# Patient Record
Sex: Male | Born: 1985 | Hispanic: No | Smoking: Never smoker
Health system: Southern US, Community
[De-identification: ages and names within clinical notes are randomized; demographics above are authoritative.]

## PROBLEM LIST (undated history)

## (undated) DIAGNOSIS — G8929 Other chronic pain: Secondary | ICD-10-CM

## (undated) DIAGNOSIS — J45909 Unspecified asthma, uncomplicated: Secondary | ICD-10-CM

## (undated) DIAGNOSIS — F419 Anxiety disorder, unspecified: Secondary | ICD-10-CM

## (undated) DIAGNOSIS — G4730 Sleep apnea, unspecified: Secondary | ICD-10-CM

## (undated) DIAGNOSIS — F32A Depression, unspecified: Secondary | ICD-10-CM

## (undated) DIAGNOSIS — R519 Headache, unspecified: Secondary | ICD-10-CM

## (undated) DIAGNOSIS — L509 Urticaria, unspecified: Secondary | ICD-10-CM

## (undated) HISTORY — DX: Sleep apnea, unspecified: G47.30

## (undated) HISTORY — DX: Other chronic pain: G89.29

## (undated) HISTORY — DX: Headache, unspecified: R51.9

## (undated) HISTORY — DX: Unspecified asthma, uncomplicated: J45.909

## (undated) HISTORY — DX: Anxiety disorder, unspecified: F41.9

## (undated) HISTORY — DX: Depression, unspecified: F32.A

## (undated) HISTORY — DX: Urticaria, unspecified: L50.9

## (undated) HISTORY — PX: HERNIA REPAIR: SHX51

---

## 2008-06-18 ENCOUNTER — Emergency Department (HOSPITAL_COMMUNITY): Admission: EM | Admit: 2008-06-18 | Discharge: 2008-06-18 | Payer: Self-pay | Admitting: Family Medicine

## 2017-03-31 ENCOUNTER — Other Ambulatory Visit (INDEPENDENT_AMBULATORY_CARE_PROVIDER_SITE_OTHER): Payer: 59

## 2017-03-31 ENCOUNTER — Encounter: Payer: Self-pay | Admitting: Family Medicine

## 2017-03-31 ENCOUNTER — Ambulatory Visit (INDEPENDENT_AMBULATORY_CARE_PROVIDER_SITE_OTHER): Payer: 59 | Admitting: Family Medicine

## 2017-03-31 VITALS — BP 110/80 | HR 62 | Ht 72.0 in | Wt 202.0 lb

## 2017-03-31 DIAGNOSIS — G2589 Other specified extrapyramidal and movement disorders: Secondary | ICD-10-CM | POA: Diagnosis not present

## 2017-03-31 DIAGNOSIS — M25559 Pain in unspecified hip: Secondary | ICD-10-CM | POA: Diagnosis not present

## 2017-03-31 DIAGNOSIS — M24552 Contracture, left hip: Secondary | ICD-10-CM | POA: Diagnosis not present

## 2017-03-31 DIAGNOSIS — M999 Biomechanical lesion, unspecified: Secondary | ICD-10-CM | POA: Diagnosis not present

## 2017-03-31 LAB — TSH: TSH: 2.56 u[IU]/mL (ref 0.35–4.50)

## 2017-03-31 LAB — IBC PANEL
IRON: 106 ug/dL (ref 42–165)
Saturation Ratios: 30.9 % (ref 20.0–50.0)
Transferrin: 245 mg/dL (ref 212.0–360.0)

## 2017-03-31 LAB — CBC WITH DIFFERENTIAL/PLATELET
BASOS PCT: 0.4 % (ref 0.0–3.0)
Basophils Absolute: 0 10*3/uL (ref 0.0–0.1)
EOS PCT: 1.6 % (ref 0.0–5.0)
Eosinophils Absolute: 0.1 10*3/uL (ref 0.0–0.7)
HEMATOCRIT: 50.6 % (ref 39.0–52.0)
Hemoglobin: 17.1 g/dL — ABNORMAL HIGH (ref 13.0–17.0)
LYMPHS ABS: 1.6 10*3/uL (ref 0.7–4.0)
LYMPHS PCT: 34.9 % (ref 12.0–46.0)
MCHC: 33.7 g/dL (ref 30.0–36.0)
MCV: 87.7 fl (ref 78.0–100.0)
MONOS PCT: 7.3 % (ref 3.0–12.0)
Monocytes Absolute: 0.3 10*3/uL (ref 0.1–1.0)
NEUTROS ABS: 2.5 10*3/uL (ref 1.4–7.7)
NEUTROS PCT: 55.8 % (ref 43.0–77.0)
PLATELETS: 161 10*3/uL (ref 150.0–400.0)
RBC: 5.77 Mil/uL (ref 4.22–5.81)
RDW: 12.8 % (ref 11.5–15.5)
WBC: 4.4 10*3/uL (ref 4.0–10.5)

## 2017-03-31 LAB — COMPREHENSIVE METABOLIC PANEL
ALBUMIN: 4.5 g/dL (ref 3.5–5.2)
ALK PHOS: 105 U/L (ref 39–117)
ALT: 24 U/L (ref 0–53)
AST: 22 U/L (ref 0–37)
BUN: 21 mg/dL (ref 6–23)
CO2: 27 mEq/L (ref 19–32)
CREATININE: 1.08 mg/dL (ref 0.40–1.50)
Calcium: 9.9 mg/dL (ref 8.4–10.5)
Chloride: 102 mEq/L (ref 96–112)
GFR: 85.04 mL/min (ref >60.00)
Glucose, Bld: 100 mg/dL — ABNORMAL HIGH (ref 70–99)
POTASSIUM: 3.9 meq/L (ref 3.5–5.1)
SODIUM: 138 meq/L (ref 135–145)
TOTAL PROTEIN: 7.3 g/dL (ref 6.0–8.3)
Total Bilirubin: 0.7 mg/dL (ref 0.2–1.2)

## 2017-03-31 LAB — SEDIMENTATION RATE: Sed Rate: 10 mm/hr (ref 0–15)

## 2017-03-31 NOTE — Assessment & Plan Note (Signed)
Believe a decent amount of patient's pain and discomfort a second her to hip flexor tightness. There is a possibility for patient having more of a polymyalgia and we will get laboratory workup to make sure that there is nothing else organic be contributing to his discomfort. We discussed ergonomics at work. Work with Product/process development scientist to learn home exercises. Follow-up again in 4-6 weeks. Responded well to a supine manipulation.

## 2017-03-31 NOTE — Assessment & Plan Note (Signed)
Decision today to treat with OMT was based on Physical Exam  After verbal consent patient was treated with HVLA, ME, FPR techniques in cervical, thoracic, lumbar and sacral areas  Patient tolerated the procedure well with improvement in symptoms  Patient given exercises, stretches and lifestyle modifications  See medications in patient instructions if given  Patient will follow up in 4 weeks 

## 2017-03-31 NOTE — Progress Notes (Signed)
[facility] [place], [place] [address] Phone: [phone] Subjective:    CC: Hip and shoulder pain  [id number]  Ricardo Alexander is a 31 y.o. male coming in with complaint of shoulder and hip pain.   Patient also discusses shoulder pain. He says he has weakness and instability in the left side. Has a history of numbness in the left arm. He says he has a lot of tension and irritation in certain places in his neck. Says it effects his activity. He feels no pain. He says he feels like he isn't getting any "output" from the arm.   Hip- No hip pain. He says he feels as if his hips are uneven and rotated. Patient also states that there is no significant pain but unfortunately feels like his left hip is not as quite as stable as his right hip. Has seen other providers for it. Has done formal physical therapy 2 times for 6 weeks each time with no significant improvement. Looking for some guidance. Continues to lift weight on a regular basis  Onset- Chronic Location- Less than Character-more of a weakness or instability feeling of the left shoulder and left hip Aggravating factors-seems worse with increasing weight Therapies tried- physical therapy and home exercises multiple times. Severity-2 out of 10     No past medical history on file. No past surgical history on file. Social History   Social History  . Marital status: Married    Spouse name: N/A  . Number of children: N/A  . Years of education: N/A   Social History Main Topics  . Smoking status: Never Smoker  . Smokeless tobacco: Never Used  . Alcohol use Not on file  . Drug use: Unknown  . Sexual activity: Not on file   Other Topics Concern  . Not on file   Social History Narrative  . No narrative on file   Allergies not on file No family history on file. No family history of autoimmune diseases   Past medical history, social, surgical and family history all reviewed in  electronic medical record.  No pertanent information unless stated regarding to the chief complaint.   Review of Systems:Review of systems updated and as accurate as of 03/31/17  No headache, visual changes, nausea, vomiting, diarrhea, constipation, dizziness, abdominal pain, skin rash, fevers, chills, night sweats, weight loss, swollen lymph nodes, body aches, joint swelling, , chest pain, shortness of breath, mood changes. Positive muscle aches  Objective  Blood pressure 110/80, pulse 62, height 6' (1.829 m), weight 202 lb (91.6 kg), SpO2 98 %. Systems examined below as of 03/31/17   General: No apparent distress alert and oriented x3 mood and affect normal, dressed appropriately.  HEENT: Pupils equal, extraocular movements intact  Respiratory: Patient's speak in full sentences and does not appear short of breath  Cardiovascular: No lower extremity edema, non tender, no erythema  Skin: Warm dry intact with no signs of infection or rash on extremities or on axial skeleton.  Abdomen: Soft nontender  Neuro: Cranial nerves II through XII are intact, neurovascularly intact in all extremities with 2+ DTRs and 2+ pulses.  Lymph: No lymphadenopathy of posterior or anterior cervical chain or axillae bilaterally.  Gait normal with good balance and coordination.  MSK:  Non tender with full range of motion and good stability and symmetric strength and tone of  elbows, wrist, knee and ankles bilaterally.  HBZ:JIRC ROM IR: 25 Deg, ER: 45 Deg, Flexion: 120 Deg,  Extension: 100 Deg, Abduction: 45 Deg, Adduction: 45 Deg Strength IR: 5/5, ER: 5/5, Flexion: 5/5, Extension: 5/5, Abduction: 5/5, Adduction: 5/5 Pelvic alignment unremarkable to inspection and palpation. Standing hip rotation and gait without trendelenburg sign / unsteadiness. Greater trochanter without tenderness to palpation. No tenderness over piriformis and greater trochanter. No pain with FABER or FADIR. Patient does have tightness of the  hip flexors bilaterally left greater than right No SI joint tenderness and normal minimal SI movement.  Shoulder: Left Inspection reveals no abnormalities, atrophy or asymmetry. Palpation is normal with no tenderness over AC joint or bicipital groove. ROM is full in all planes. Rotator cuff strength normal throughout. No signs of impingement with negative Neer and Hawkin's tests, empty can sign. Speeds and Yergason's tests normal. No labral pathology noted with negative Obrien's, negative clunk and good stability. Normal scapular function observed. No painful arc and no drop arm sign. No apprehension sign Very mild scapular dyskinesia left couldn't and right  Osteopathic findings C2 flexed rotated and side bent left  T5 extended rotated and side bent left L2 flexed rotated and side bent left  Sacrum left on left     Impression and Recommendations:     This case required medical decision making of moderate complexity.      Note: This dictation was prepared with Dragon dictation along with smaller phrase technology. Any transcriptional errors that result from this process are unintentional.

## 2017-03-31 NOTE — Assessment & Plan Note (Signed)
[id number]; 15 additional minutes spent for Therapeutic exercises as stated in above notes.  This included exercises focusing on stretching, strengthening, with significant focus on eccentric aspects.   Long term goals include an improvement in range of motion, strength, endurance as well as avoiding reinjury. Patient's frequency would include in 1-2 times a day, 3-5 times a week for a duration of 6-12 weeks.  Exercises that included:  Basic scapular stabilization to include adduction and depression of scapula Scaption, focusing on proper movement and good control Internal and External rotation utilizing a theraband, with elbow tucked at side entire time Rows with theraband which was given  Proper technique shown and discussed handout in great detail with ATC.  All questions were discussed and answered.

## 2017-03-31 NOTE — Patient Instructions (Addendum)
Good to see you  Ice 20 minutes 2 times daily. Usually after activity and before bed. Exercises 3 times a week.  pennsaid pinkie amount topically 2 times daily as needed.   We will get labs [facility] the desk helps See me again in 4-6 weeks.

## 2017-04-04 LAB — VITAMIN D 1,25 DIHYDROXY
Vitamin D 1, 25 (OH)2 Total: 65 pg/mL (ref 18–72)
Vitamin D2 1, 25 (OH)2: <8 pg/mL
Vitamin D3 1, 25 (OH)2: 65 pg/mL

## 2017-04-04 LAB — ANA: ANA: NEGATIVE

## 2017-04-04 LAB — PTH, INTACT AND CALCIUM
CALCIUM: 9.7 mg/dL (ref 8.6–10.3)
PTH: 46 pg/mL (ref 14–64)

## 2017-04-04 LAB — RHEUMATOID FACTOR: Rhuematoid fact SerPl-aCnc: <14 IU/mL (ref <14)

## 2017-04-13 ENCOUNTER — Telehealth: Payer: Self-pay

## 2017-04-13 NOTE — Telephone Encounter (Signed)
-----   Message from Lyndal Pulley, DO sent at 04/12/2017  6:35 AM EST ----- When you can call him and tell him all labs are normal

## 2017-04-13 NOTE — Progress Notes (Signed)
Called patient at 1:27pm and they did not pick up. Gave them number to the main line. Will try again tomorrow.

## 2017-04-26 NOTE — Progress Notes (Signed)
Patient did not answer phone again.

## 2017-05-03 NOTE — Progress Notes (Signed)
[facility] [place], [place] [address] Phone: [phone] Subjective:     CC: Muscle pain  [id number]  Ricardo Alexander is a 31 y.o. male coming in with complaint of pain.  Found to have hip flexor tightness.  Also some scapular dyskinesis.  Has responded fairly well to osteopathic manipulation and home exercises.  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  Patient states continuing to have pain.  Seems to be on the left side of his body.  Correct.  Has seen multiple different providers for this previously.  Patient is concerned because he continues to not see any significant improvement.      History reviewed. No pertinent past medical history. History reviewed. No pertinent surgical history. Social History   Socioeconomic History  . Marital status: Married    Spouse name: None  . Number of children: None  . Years of education: None  . Highest education level: None  Social Needs  . Financial resource strain: None  . Food insecurity - worry: None  . Food insecurity - inability: None  . Transportation needs - medical: None  . Transportation needs - non-medical: None  Occupational History  . None  Tobacco Use  . Smoking status: Never Smoker  . Smokeless tobacco: Never Used  Substance and Sexual Activity  . Alcohol use: None  . Drug use: None  . Sexual activity: None  Other Topics Concern  . None  Social History Narrative  . None   Not on File History reviewed. No pertinent family history.  No family history of autoimmune disease   Past medical history, social, surgical and family history all reviewed in electronic medical record.  No pertanent information unless stated regarding to the chief complaint.   Review of Systems:Review of systems updated and as accurate as of 05/04/17  No headache, visual changes, nausea, vomiting, diarrhea, constipation,  dizziness, abdominal pain, skin rash, fevers, chills, night sweats, weight loss, swollen lymph nodes, body aches, joint swelling, muscle aches, chest pain, shortness of breath, mood changes.   Objective  Blood pressure 130/60, pulse 88, height 6' (1.829 m), weight 206 lb (93.4 kg), SpO2 96 %. Systems examined below as of 05/04/17   General: No apparent distress alert and oriented x3 mood and affect normal, dressed appropriately.  HEENT: Pupils equal, extraocular movements intact  Respiratory: Patient's speak in full sentences and does not appear short of breath  Cardiovascular: No lower extremity edema, non tender, no erythema  Skin: Warm dry intact with no signs of infection or rash on extremities or on axial skeleton.  Abdomen: Soft nontender  Neuro: Cranial nerves II through XII are intact, neurovascularly intact in all extremities with 2+ DTRs and 2+ pulses.  Lymph: No lymphadenopathy of posterior or anterior cervical chain or axillae bilaterally.  Gait normal with good balance and coordination.  MSK:  Non tender with full range of motion and good stability and symmetric strength and tone of shoulders, elbows, wrist, hip, knee and ankles bilaterally.  Neck: Inspection unremarkable. No palpable stepoffs. Negative Spurling's maneuver. Very mild loss of range of motion with left-sided rotation. Grip strength and sensation normal in bilateral hands Strength good C4 to T1 distribution No sensory change to C4 to T1 Negative Hoffman sign bilaterally Reflexes normal Mild tightness of the left trapezius area compared to the contralateral side  Osteopathic findings C2 flexed rotated and side bent right C4 flexed rotated and  side bent left C7 flexed rotated and side bent left T3 extended rotated and side bent left inhaled third rib T6 extended rotated and side bent left L3 flexed rotated and side bent left Sacrum right on right     Impression and Recommendations:     This case  required medical decision making of moderate complexity.      Note: This dictation was prepared with Dragon dictation along with smaller phrase technology. Any transcriptional errors that result from this process are unintentional.

## 2017-05-04 ENCOUNTER — Encounter: Payer: Self-pay | Admitting: Family Medicine

## 2017-05-04 ENCOUNTER — Ambulatory Visit (INDEPENDENT_AMBULATORY_CARE_PROVIDER_SITE_OTHER): Payer: 59 | Admitting: Family Medicine

## 2017-05-04 VITALS — BP 130/60 | HR 88 | Ht 72.0 in | Wt 206.0 lb

## 2017-05-04 DIAGNOSIS — M999 Biomechanical lesion, unspecified: Secondary | ICD-10-CM | POA: Diagnosis not present

## 2017-05-04 DIAGNOSIS — G2589 Other specified extrapyramidal and movement disorders: Secondary | ICD-10-CM

## 2017-05-04 NOTE — Patient Instructions (Signed)
Good to see you  Vitamin D 2000 IU  Daily  Turmeric 500mg  daily  Stay active.  Work on upper back more then neck  Dumbbells instead of bar Keep hands within peripheral vision.  See me again in 4 weeks

## 2017-05-04 NOTE — Assessment & Plan Note (Signed)
Still believe a lot of patient's discomfort and pain secondary to more muscle imbalances.  We discussed with patient again about possibly doing more isolation exercises with dumbbells instead of barbells.  Patient has done formal physical therapy previously.  I do not feel that advanced imaging would be warranted with no significant weakness noted on exam today and would likely not change any significant management.  Patient does not want to try any other type of medications.  Follow-up again in 4-6 weeks

## 2017-05-04 NOTE — Assessment & Plan Note (Signed)
Decision today to treat with OMT was based on Physical Exam  After verbal consent patient was treated with HVLA, ME, FPR techniques in cervical, thoracic, lumbar and sacral areas  Patient tolerated the procedure well with improvement in symptoms  Patient given exercises, stretches and lifestyle modifications  See medications in patient instructions if given  Patient will follow up in 4 weeks 

## 2017-06-02 NOTE — Progress Notes (Signed)
[facility] [place], [place] [address] Phone: [phone] Subjective:     CC: Polymyalgia shoulder dyskinesia follow-up  [id number]  Ricardo Alexander is a 31 y.o. male coming in with complaint of back pain and pain all over.  Found to have more of a scapular dyskinesia as well as tight hip flexors.  Patient has been doing some of the home exercises.  Patient did have workup for polymyalgia but did not show anything significant.  Patient states that he continues to have the difficulty on the left side.  Still just feels like it is not functioning correctly.  Patient has been doing the exercises fairly regularly.  Patient feels like she did not have any improvement with the manipulation previously.  Has not noticed any improvement with the vitamin supplementations at this time.  Still frustrated overall.     No past medical history on file. No past surgical history on file. Social History   Socioeconomic History  . Marital status: Married    Spouse name: None  . Number of children: None  . Years of education: None  . Highest education level: None  Social Needs  . Financial resource strain: None  . Food insecurity - worry: None  . Food insecurity - inability: None  . Transportation needs - medical: None  . Transportation needs - non-medical: None  Occupational History  . None  Tobacco Use  . Smoking status: Never Smoker  . Smokeless tobacco: Never Used  Substance and Sexual Activity  . Alcohol use: None  . Drug use: None  . Sexual activity: None  Other Topics Concern  . None  Social History Narrative  . None   Not on File No family history on file.   Past medical history, social, surgical and family history all reviewed in electronic medical record.  No pertanent information unless stated regarding to the chief complaint.   Review of Systems:Review of systems updated and as accurate as of 06/03/17  No headache, visual changes,  nausea, vomiting, diarrhea, constipation, dizziness, abdominal pain, skin rash, fevers, chills, night sweats, weight loss, swollen lymph nodes, body aches, joint swelling, muscle aches, chest pain, shortness of breath, mood changes.   Objective  Blood pressure 130/82, pulse 66, height 6' (1.829 m), weight 208 lb (94.3 kg), SpO2 98 %. Systems examined below as of 06/03/17   General: No apparent distress alert and oriented x3 mood and affect normal, dressed appropriately.  HEENT: Pupils equal, extraocular movements intact  Respiratory: Patient's speak in full sentences and does not appear short of breath  Cardiovascular: No lower extremity edema, non tender, no erythema  Skin: Warm dry intact with no signs of infection or rash on extremities or on axial skeleton.  Abdomen: Soft nontender  Neuro: Cranial nerves II through XII are intact, neurovascularly intact in all extremities with 2+ DTRs and 2+ pulses.  Lymph: No lymphadenopathy of posterior or anterior cervical chain or axillae bilaterally.  Gait normal with good balance and coordination.  MSK:  Non tender with full range of motion and good stability and symmetric strength and tone of shoulders, elbows, wrist, hip, knee and ankles bilaterally.  Neck: Inspection unremarkable. No palpable stepoffs. Negative Spurling's maneuver. Full neck range of motion Grip strength and sensation normal in bilateral hands Strength good C4 to T1 distribution No sensory change to C4 to T1 Negative Hoffman sign bilaterally Reflexes normal Left scapula still has some dyskinesia.  Mild winging noted.  Osteopathic findings C2  flexed rotated and side bent left  C4 flexed rotated and side bent left C6 flexed rotated and side bent left T3 extended rotated and side bent left inhaled third rib T9 extended rotated and side bent left Sacrum right on right    Impression and Recommendations:     This case required medical decision making of moderate  complexity.      Note: This dictation was prepared with Dragon dictation along with smaller phrase technology. Any transcriptional errors that result from this process are unintentional.

## 2017-06-03 ENCOUNTER — Ambulatory Visit (INDEPENDENT_AMBULATORY_CARE_PROVIDER_SITE_OTHER): Payer: 59 | Admitting: Family Medicine

## 2017-06-03 ENCOUNTER — Encounter: Payer: Self-pay | Admitting: Family Medicine

## 2017-06-03 VITALS — BP 130/82 | HR 66 | Ht 72.0 in | Wt 208.0 lb

## 2017-06-03 DIAGNOSIS — G2589 Other specified extrapyramidal and movement disorders: Secondary | ICD-10-CM | POA: Diagnosis not present

## 2017-06-03 DIAGNOSIS — M999 Biomechanical lesion, unspecified: Secondary | ICD-10-CM | POA: Diagnosis not present

## 2017-06-03 MED ORDER — GABAPENTIN 100 MG PO CAPS: 200.0000 mg | ORAL_CAPSULE | Freq: Every day | ORAL | 3 refills | Status: DC

## 2017-06-03 NOTE — Assessment & Plan Note (Signed)
Decision today to treat with OMT was based on Physical Exam  After verbal consent patient was treated with HVLA, ME, FPR techniques in  Thoracic, rib, lumbar and sacral areas  Patient tolerated the procedure well with improvement in symptoms  Patient given exercises, stretches and lifestyle modifications  See medications in patient instructions if given  Patient will follow up in 4-6 weeks

## 2017-06-03 NOTE — Assessment & Plan Note (Signed)
Scapular dyskinesis noted.  Discussed with patient at great length about icing regimen and home exercises.  Discussed avoiding certain activities.  Patient will increase activity slowly.  Started on gabapentin to see if this will make any significant improvement.  Patient will follow up with me again 4-6 weeks

## 2017-06-03 NOTE — Patient Instructions (Signed)
Good to see you  Ricardo Alexander is your friend.  Lets try 200mg  of gabapentin at night to see if this is nerve related.  Keep doing everything else Teresa Coombs or consider Vivi Ferns for the soft tissue as pect  See me again in 4-6 weeks

## 2017-07-07 ENCOUNTER — Ambulatory Visit: Payer: 59 | Admitting: Family Medicine

## 2017-07-07 DIAGNOSIS — Z0289 Encounter for other administrative examinations: Secondary | ICD-10-CM

## 2017-07-07 NOTE — Progress Notes (Deleted)
  [facility] [place], [name] [address] Phone: [phone] Subjective:    I'm seeing this patient by the request  of:    CC: Back pain follow-up  [id number]  Ricardo Alexander is a 32 y.o. male coming in with complaint of back pain follow-up.  Found to have tight hip flexors as well as scapular dyskinesis.  Patient has responded fairly well to osteopathic manipulation and home exercises.  Patient states      No past medical history on file. No past surgical history on file. Social History   Socioeconomic History  . Marital status: Married    Spouse name: Not on file  . Number of children: Not on file  . Years of education: Not on file  . Highest education level: Not on file  Social Needs  . Financial resource strain: Not on file  . Food insecurity - worry: Not on file  . Food insecurity - inability: Not on file  . Transportation needs - medical: Not on file  . Transportation needs - non-medical: Not on file  Occupational History  . Not on file  Tobacco Use  . Smoking status: Never Smoker  . Smokeless tobacco: Never Used  Substance and Sexual Activity  . Alcohol use: Not on file  . Drug use: Not on file  . Sexual activity: Not on file  Other Topics Concern  . Not on file  Social History Narrative  . Not on file   Not on File No family history on file.   Past medical history, social, surgical and family history all reviewed in electronic medical record.  No pertanent information unless stated regarding to the chief complaint.   Review of Systems:Review of systems updated and as accurate as of 07/07/17  No headache, visual changes, nausea, vomiting, diarrhea, constipation, dizziness, abdominal pain, skin rash, fevers, chills, night sweats, weight loss, swollen lymph nodes, body aches, joint swelling, muscle aches, chest pain, shortness of breath, mood changes.   Objective  There were no vitals taken for this visit. Systems  examined below as of 07/07/17   General: No apparent distress alert and oriented x3 mood and affect normal, dressed appropriately.  HEENT: Pupils equal, extraocular movements intact  Respiratory: Patient's speak in full sentences and does not appear short of breath  Cardiovascular: No lower extremity edema, non tender, no erythema  Skin: Warm dry intact with no signs of infection or rash on extremities or on axial skeleton.  Abdomen: Soft nontender  Neuro: Cranial nerves II through XII are intact, neurovascularly intact in all extremities with 2+ DTRs and 2+ pulses.  Lymph: No lymphadenopathy of posterior or anterior cervical chain or axillae bilaterally.  Gait normal with good balance and coordination.  MSK:  Non tender with full range of motion and good stability and symmetric strength and tone of shoulders, elbows, wrist, hip, knee and ankles bilaterally.     Impression and Recommendations:     This case required medical decision making of moderate complexity.      Note: This dictation was prepared with Dragon dictation along with smaller phrase technology. Any transcriptional errors that result from this process are unintentional.

## 2021-03-19 ENCOUNTER — Other Ambulatory Visit: Payer: Self-pay

## 2021-03-19 ENCOUNTER — Ambulatory Visit
Admission: EM | Admit: 2021-03-19 | Discharge: 2021-03-19 | Disposition: A | Discharge status: Home / Self Care | Payer: 59 | Attending: Emergency Medicine | Admitting: Emergency Medicine

## 2021-03-19 DIAGNOSIS — J029 Acute pharyngitis, unspecified: Secondary | ICD-10-CM | POA: Insufficient documentation

## 2021-03-19 DIAGNOSIS — B349 Viral infection, unspecified: Secondary | ICD-10-CM | POA: Diagnosis present

## 2021-03-19 DIAGNOSIS — R0981 Nasal congestion: Secondary | ICD-10-CM | POA: Insufficient documentation

## 2021-03-19 DIAGNOSIS — R110 Nausea: Secondary | ICD-10-CM | POA: Insufficient documentation

## 2021-03-19 DIAGNOSIS — R519 Headache, unspecified: Secondary | ICD-10-CM | POA: Diagnosis present

## 2021-03-19 LAB — POCT INFLUENZA A/B
Influenza A, POC: NEGATIVE
Influenza B, POC: NEGATIVE

## 2021-03-19 LAB — POCT RAPID STREP A (OFFICE): Rapid Strep A Screen: NEGATIVE

## 2021-03-19 NOTE — ED Provider Notes (Signed)
[facility]    CSN: [id number] Arrival date & time: 03/19/21  1304      History   Chief Complaint Chief Complaint  Patient presents with   Nasal Congestion   Chills    HPI Ricardo Alexander is a 35 y.o. male.   Patient is here with his wife today.  Patient states that for the last 3 days he has had body aches, headache, chills, sweats, congestion and sinus pressure.  Patient reports taking 2 home COVID tests, both negative.  Patient's wife states she just finished her quarantine from [place] 4 days ago.  Patient states he does not have a history of recurrent sinusitis or upper respiratory illness, states he is otherwise in good health with no significant medical history.  The history is provided by the patient and the spouse.   History reviewed. No pertinent past medical history.  Patient Active Problem List   Diagnosis Date Noted   Hip flexor tightness, left 03/31/2017   Scapular dyskinesis 03/31/2017   Nonallopathic lesion of thoracic region 03/31/2017   Nonallopathic lesion of lumbosacral region 03/31/2017   Nonallopathic lesion of sacral region 03/31/2017    History reviewed. No pertinent surgical history.     Home Medications    Prior to Admission medications   Medication Sig Start Date End Date Taking? Authorizing Provider  gabapentin (NEURONTIN) 100 MG capsule Take 2 capsules (200 mg total) by mouth at bedtime. 06/03/17   Lyndal Pulley, DO    Family History No family history on file.  Social History Social History   Tobacco Use   Smoking status: Never   Smokeless tobacco: Never  Vaping Use   Vaping Use: Never used  Substance Use Topics   Alcohol use: Never   Drug use: Never     Allergies   Patient has no allergy information on record.   Review of Systems Review of Systems Pertinent findings noted in history of present illness.    Physical Exam Triage Vital Signs ED Triage Vitals  Enc Vitals Group     BP      Pulse      Resp       Temp      Temp src      SpO2      Weight      Height      Head Circumference      Peak Flow      Pain Score      Pain Loc      Pain Edu?      Excl. in [place]?    No data found.  Updated Vital Signs BP 129/80 (BP Location: Right Arm)   Pulse 61   Temp 97.7 F (36.5 C) (Oral)   Resp 20   SpO2 97%   Visual Acuity Right Eye Distance:   Left Eye Distance:   Bilateral Distance:    Right Eye Near:   Left Eye Near:    Bilateral Near:     Physical Exam Vitals and nursing note reviewed.  Constitutional:      Appearance: Normal appearance.  HENT:     Head: Normocephalic and atraumatic.     Right Ear: Tympanic membrane, ear canal and external ear normal.     Left Ear: Tympanic membrane, ear canal and external ear normal.     Nose: Nose normal.     Mouth/Throat:     Mouth: Mucous membranes are moist.     Pharynx: Oropharynx is clear. Uvula midline.  Posterior oropharyngeal erythema and uvula swelling present.  Eyes:     Extraocular Movements: Extraocular movements intact.     Conjunctiva/sclera: Conjunctivae normal.     Pupils: Pupils are equal, round, and reactive to light.  Cardiovascular:     Rate and Rhythm: Normal rate and regular rhythm.     Heart sounds: Normal heart sounds.  Pulmonary:     Effort: Pulmonary effort is normal.     Breath sounds: Normal breath sounds.  Abdominal:     General: Abdomen is flat. Bowel sounds are normal.     Palpations: Abdomen is soft.  Musculoskeletal:        General: Normal range of motion.     Cervical back: Normal range of motion and neck supple.  Skin:    General: Skin is warm and dry.  Neurological:     General: No focal deficit present.     Mental Status: He is alert and oriented to person, place, and time.  Psychiatric:        Mood and Affect: Mood normal.        Behavior: Behavior normal.     UC Treatments / Results  Labs (all labs ordered are listed, but only abnormal results are displayed) Labs Reviewed   CULTURE, GROUP A STREP ([place])  NOVEL CORONAVIRUS, NAA  POCT INFLUENZA A/B  POCT RAPID STREP A (OFFICE)    EKG   Radiology No results found.  Procedures Procedures (including critical care time)  Medications Ordered in UC Medications - No data to display  Initial Impression / Assessment and Plan / UC Course  I have reviewed the triage vital signs and the nursing notes.  Pertinent labs & imaging results that were available during my care of the patient were reviewed by me and considered in my medical decision making (see chart for details).     Physical exam today is unremarkable with the exception of diffuse erythema in his posterior pharynx.  Given this and patient's reported symptoms, I feel is appropriate to test him for strep, COVID and flu.  Patient advised that rapid strep and rapid flu test were both negative and that he will be advised of the results of his COVID test once received.  Conservative care was recommended.  Patient declined AVS prior to departure.  Patient verbalized understanding and agreement of plan as discussed.  All questions were addressed during visit.  Please see discharge instructions below for further details of plan.  Final Clinical Impressions(s) / UC Diagnoses   Final diagnoses:  Sore throat  Congestion of nasal sinus  Nausea without vomiting  Nonintractable headache, unspecified chronicity pattern, unspecified headache type  Viral illness   Discharge Instructions   None    ED Prescriptions   None    PDMP not reviewed this encounter.   Lynden [facility] Scales, [place] 03/20/21 [phone]

## 2021-03-19 NOTE — ED Triage Notes (Signed)
Patient reports for three days he has had chills, sweats, headache, congestion, body aches, and sinus pressure.   Patient reports taking an at home Covid test that was negative.

## 2021-03-20 LAB — NOVEL CORONAVIRUS, NAA: SARS-CoV-2, NAA: NOT DETECTED

## 2021-03-20 LAB — SARS-COV-2, NAA 2 DAY TAT

## 2021-03-22 LAB — CULTURE, GROUP A STREP (THRC): Special Requests: NORMAL

## 2021-10-21 ENCOUNTER — Ambulatory Visit
Admission: EM | Admit: 2021-10-21 | Discharge: 2021-10-21 | Disposition: A | Discharge status: Home / Self Care | Payer: 59

## 2021-10-21 DIAGNOSIS — R519 Headache, unspecified: Secondary | ICD-10-CM | POA: Diagnosis not present

## 2021-10-21 NOTE — ED Provider Notes (Signed)
?[facility] ? ? ? ?CSN: [id number] ?Arrival date & time: 10/21/21  1706 ?  ? ?HISTORY  ? ?Chief Complaint  ?Patient presents with  ? Generalized Body Aches  ? Headache  ? Hoarse  ? ?HPI ?Ricardo Alexander is a 36 y.o. male. Pt c/o swelling in his throat his throat, hoarseness, headahce, nausea, chills, fatigue, and elevated heart rate in the 70s.  Patient's heart rate on arrival is 57.  Pt states he believes he is having issues with his thyroid, and believes his sister has issues with her thyroid. Patient does not display signs/ symptoms of respiratory distress.  Patient states he took an Aleve about an hour ago for his headache which helped. ? ?The history is provided by the patient.  ?History reviewed. No pertinent past medical history. ?Patient Active Problem List  ? Diagnosis Date Noted  ? Hip flexor tightness, left 03/31/2017  ? Scapular dyskinesis 03/31/2017  ? Nonallopathic lesion of thoracic region 03/31/2017  ? Nonallopathic lesion of lumbosacral region 03/31/2017  ? Nonallopathic lesion of sacral region 03/31/2017  ? ?History reviewed. No pertinent surgical history. ? ?Home Medications   ? ?Prior to Admission medications   ?Not on File  ? ?Family History ?History reviewed. No pertinent family history. ?Social History ?Social History  ? ?Tobacco Use  ? Smoking status: Never  ? Smokeless tobacco: Never  ?Vaping Use  ? Vaping Use: Never used  ?Substance Use Topics  ? Alcohol use: Never  ? Drug use: Never  ? ?Allergies   ?Patient has no allergy information on record. ? ?Review of Systems ?Review of Systems ?Pertinent findings noted in history of present illness.  ? ?Physical Exam ?Triage Vital Signs ?ED Triage Vitals  ?Enc Vitals Group  ?   BP 04/04/21 0827 (!) 147/82  ?   Pulse Rate 04/04/21 0827 72  ?   Resp 04/04/21 0827 18  ?   Temp 04/04/21 0827 98.3 ?F (36.8 ?C)  ?   Temp Source 04/04/21 0827 Oral  ?   SpO2 04/04/21 0827 98 %  ?   Weight --   ?   Height --   ?   Head Circumference --   ?   Peak  Flow --   ?   Pain Score 04/04/21 0826 5  ?   Pain Loc --   ?   Pain Edu? --   ?   Excl. in [place]? --   ?No data found. ? ?Updated Vital Signs ?BP 131/77 (BP Location: Left Arm)   Pulse (!) 57   Temp 98.4 ?F (36.9 ?C) (Oral)   Resp 20   SpO2 97%  ? ?Physical Exam ?Vitals and nursing note reviewed.  ?Constitutional:   ?   General: He is not in acute distress. ?   Appearance: Normal appearance. He is not ill-appearing.  ?HENT:  ?   Head: Normocephalic and atraumatic.  ?   Salivary Glands: Right salivary gland is not diffusely enlarged or tender. Left salivary gland is not diffusely enlarged or tender.  ?   Right Ear: Tympanic membrane, ear canal and external ear normal. No drainage. No middle ear effusion. There is no impacted cerumen. Tympanic membrane is not erythematous or bulging.  ?   Left Ear: Tympanic membrane, ear canal and external ear normal. No drainage.  No middle ear effusion. There is no impacted cerumen. Tympanic membrane is not erythematous or bulging.  ?   Nose: Nose normal. No nasal deformity, septal deviation, mucosal edema, congestion  or rhinorrhea.  ?   Right Turbinates: Not enlarged, swollen or pale.  ?   Left Turbinates: Not enlarged, swollen or pale.  ?   Right Sinus: No maxillary sinus tenderness or frontal sinus tenderness.  ?   Left Sinus: No maxillary sinus tenderness or frontal sinus tenderness.  ?   Mouth/Throat:  ?   Lips: Pink. No lesions.  ?   Mouth: Mucous membranes are moist. No oral lesions.  ?   Pharynx: Oropharynx is clear. Uvula midline. No posterior oropharyngeal erythema or uvula swelling.  ?   Tonsils: No tonsillar exudate. 0 on the right. 0 on the left.  ?Eyes:  ?   General: Lids are normal.     ?   Right eye: No discharge.     ?   Left eye: No discharge.  ?   Extraocular Movements: Extraocular movements intact.  ?   Conjunctiva/sclera: Conjunctivae normal.  ?   Right eye: Right conjunctiva is not injected.  ?   Left eye: Left conjunctiva is not injected.  ?Neck:  ?    Trachea: Trachea and phonation normal.  ?Cardiovascular:  ?   Rate and Rhythm: Normal rate and regular rhythm.  ?   Pulses: Normal pulses.  ?   Heart sounds: Normal heart sounds. No murmur heard. ?  No friction rub. No gallop.  ?Pulmonary:  ?   Effort: Pulmonary effort is normal. No accessory muscle usage, prolonged expiration or respiratory distress.  ?   Breath sounds: Normal breath sounds. No stridor, decreased air movement or transmitted upper airway sounds. No decreased breath sounds, wheezing, rhonchi or rales.  ?Chest:  ?   Chest wall: No tenderness.  ?Musculoskeletal:     ?   General: Normal range of motion.  ?   Cervical back: Normal range of motion and neck supple. Normal range of motion.  ?Lymphadenopathy:  ?   Cervical: No cervical adenopathy.  ?Skin: ?   General: Skin is warm and dry.  ?   Findings: No erythema or rash.  ?Neurological:  ?   General: No focal deficit present.  ?   Mental Status: He is alert and oriented to person, place, and time.  ?Psychiatric:     ?   Mood and Affect: Mood normal.     ?   Behavior: Behavior normal.  ? ? ?Visual Acuity ?Right Eye Distance:   ?Left Eye Distance:   ?Bilateral Distance:   ? ?Right Eye Near:   ?Left Eye Near:    ?Bilateral Near:    ? ?UC Couse / Diagnostics / Procedures:  ?  ?EKG ? ?Radiology ?No results found. ? ?Procedures ?Procedures (including critical care time) ? ?UC Diagnoses / Final Clinical Impressions(s)   ?I have reviewed the triage vital signs and the nursing notes. ? ?Pertinent labs & imaging results that were available during my care of the patient were reviewed by me and considered in my medical decision making (see chart for details).   ?Final diagnoses:  ?Acute nonintractable headache, unspecified headache type  ? ?Patient advised that we do not do thyroid screening here.  Patient also advised that he appears well on exam.  Patient advised to schedule appointment with his primary care provider for physical and thyroid screening. ? ?ED  Prescriptions   ?None ?  ? ?PDMP not reviewed this encounter. ? ?Pending results:  ?Labs Reviewed - No data to display ? ? ?Medications Ordered in UC: ?Medications - No data to display ? ?Disposition Upon  Discharge:  ?Condition: stable for discharge home ?Home: take medications as prescribed; routine discharge instructions as discussed; follow up as advised. ? ?Patient presented with an acute illness with associated systemic symptoms and significant discomfort requiring urgent management. In my opinion, this is a condition that a prudent lay person (someone who possesses an average knowledge of health and medicine) may potentially expect to result in complications if not addressed urgently such as respiratory distress, impairment of bodily function or dysfunction of bodily organs.  ? ?Routine symptom specific, illness specific and/or disease specific instructions were discussed with the patient and/or caregiver at length.  ? ?As such, the patient has been evaluated and assessed, work-up was performed and treatment was provided in alignment with urgent care protocols and evidence based medicine.  Patient/parent/caregiver has been advised that the patient may require follow up for further testing and treatment if the symptoms continue in spite of treatment, as clinically indicated and appropriate. ? ?If the patient was tested for COVID-19, Influenza and/or RSV, then the patient/parent/guardian was advised to isolate at home pending the results of his/her diagnostic coronavirus test and potentially longer if they?re positive. I have also advised pt that if his/her COVID-19 test returns positive, it's recommended to self-isolate for at least 10 days after symptoms first appeared AND until fever-free for 24 hours without fever reducer AND other symptoms have improved or resolved. Discussed self-isolation recommendations as well as instructions for household member/close contacts as per the [facility] and [place] DHHS, and also gave  patient the [place] packet with this information. ? ?Patient/parent/caregiver has been advised to return to the [facility] or PCP in 3-5 days if no better; to PCP or the Emergency Department if new signs and symptoms develop,

## 2021-10-21 NOTE — ED Triage Notes (Signed)
Pt C/O swelling in his throat his throat hoarseness, headahce, nausea, chills, and fatigue, elevated heart rate. Pt states he believes he is havsing issues with his thyroid, and believes his sister has issues with her thyroid. Patient does not display signs/ symptoms of respiratory distress.   ? ?Home interventions: aleve about an hour ago for the headache  ?

## 2021-10-29 ENCOUNTER — Other Ambulatory Visit: Payer: Self-pay | Admitting: Family Medicine

## 2021-10-29 DIAGNOSIS — E049 Nontoxic goiter, unspecified: Secondary | ICD-10-CM

## 2021-10-30 ENCOUNTER — Ambulatory Visit
Admission: RE | Admit: 2021-10-30 | Discharge: 2021-10-30 | Disposition: A | Discharge status: Home / Self Care | Payer: 59 | Source: Ambulatory Visit | Attending: Family Medicine | Admitting: Family Medicine

## 2021-10-30 DIAGNOSIS — E049 Nontoxic goiter, unspecified: Secondary | ICD-10-CM

## 2022-03-12 NOTE — Progress Notes (Addendum)
New Patient Note  RE: Zohan Shiflet MRN: [id number] DOB: 07/27/1985 Date of Office Visit: 03/13/2022  Consult requested by: Turmel, Lenord Fellers, PA Primary care provider: Patient, No Pcp Per  Chief Complaint: Other (Food sensitivity. On a diet of meat and eggs only because that is all his body seems to tolerate. Had hives five years ago randomly after going to a restaurant where he ate pasta and sauce.)  History of Present Illness: I had the pleasure of seeing Linnie Mcglocklin for initial evaluation at the [facility] on 03/13/2022. He is a 36 y.o. male, who is referred here by Cephas Darby, PA for the evaluation of hives/food sensitivity.  Patient noted issues with skin irritation, changes in bowel habits, and headaches for the past 5+ years. This seems to be worsening now and noted more foods that cause these problems.   Initially he eliminated gluten and dairy which helped. Overtime he noted that peppers, tomatoes, vinegar and soy seems to worsen symptoms as well.  A few months ago he changed his diet and currently mainly eating a ketogenic diet with meat, eggs, salad and olive oil, fermented vegetables.  Symptoms usually start within 1 hour and sometimes it takes days to resolve.  Past work up includes: none. Dietary History: patient has been eating other foods including eggs, shellfish, meats, and limited vegetables.  Sometimes takes omeprazole prn with good benefit.   No prior GI evaluation. Denies recent tick bites.  He eats red meat on a consistent basis with no issues.   Reviewed las from 10/28/2021 - crp, ANA, B12,ESR, CMP, CBC diff, TSH all unremarkable.  Low vit D.  Assessment and Plan: Ousman is a 36 y.o. male with: Food intolerance Patient noted skin irritation, changes in bowel habits, headaches for the past 5+ years which seems to be worsening. Usually occurs within 1 hour of ingestion but can lasts for days. Current diet mainly consists of meats, eggs,  salad, olive oil and fermented vegetables. No prior allergy testing or GI evaluation.  Discussed with patient that skin prick testing and bloodwork (food IgE levels) check for IgE mediated reactions which his clinical presentation does not really support.  Today's skin testing was negative to food panel.  Keep a food journal with symptoms and foods eaten. Avoid foods that are bothersome. Recommend GI evaluation next - referral placed.  Heartburn See handout for lifestyle and dietary modifications. May take omeprazole as needed.   Return if symptoms worsen or fail to improve.  No orders of the defined types were placed in this encounter.  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 Rhino conjunctivitis: no Medication allergy: no Hymenoptera allergy: no Urticaria:  one episode of hives 5 years ago. Eczema:no History of recurrent infections suggestive of immunodeficency: no  Diagnostics: Skin Testing: Food allergy panel. Negative to food panel. Results discussed with patient/family.  Food Adult Perc - 03/13/22 1400     Time Antigen Placed 1443    Allergen Manufacturer Lavella Hammock    Location Back    Number of allergen test 72     Control-buffer 50% Glycerol Negative    Control-Histamine 1 mg/ml 3+    1. Peanut Negative    2. Soybean Negative    3. Wheat Negative    4. Sesame Negative    5. Milk, cow Negative    6. Egg White, Chicken  Negative    7. Casein Negative    8. Shellfish Mix Negative    9. Fish Mix Negative    10. Cashew Negative    11. Pecan Food Negative    12. [place] Negative    13. Almond Negative    14. Hazelnut Negative    15. [place] nut Negative    16. Coconut Negative    17. Pistachio Negative    18. Catfish Negative    19. Bass Negative    20. Trout Negative    21. Tuna Negative    22. Salmon Negative    23. Flounder Negative    24. Codfish  Negative    25. Shrimp Negative    26. Crab Negative    27. Lobster Negative    28. Oyster Negative    29. Scallops Negative    30. Barley Negative    31. Oat  Negative    32. Rye  Negative    33. Hops Negative    34. Rice Negative    35. Cottonseed Negative    36. Saccharomyces Cerevisiae  Negative    37. Pork Negative    38. [place] Meat Negative    39. Chicken Meat Negative    40. Beef Negative    41. Lamb Negative    42. Tomato Negative    43. White Potato Negative    44. Sweet Potato Negative    45. Pea, Green/English Negative    46. Navy Bean Negative    47. Mushrooms Negative    48. Avocado Negative    49. Onion Negative    50. Cabbage Negative    51. Carrots Negative    52. Celery Negative    53. Corn Negative    54. Cucumber Negative    55. Grape (White seedless) Negative    56. Orange  Negative    57. Banana Negative    58. Apple Negative    59. Peach Negative    60. Strawberry Negative    61. Cantaloupe Negative    62. Watermelon Negative    63. Pineapple Negative    64. Chocolate/Cacao bean Negative    65. Karaya Gum Negative    66. Acacia (Arabic Gum) Negative    67. Cinnamon Negative    68. Nutmeg Negative    69. Ginger Negative    70. Garlic Negative    71. Pepper, black Negative    72. Mustard Negative             Past Medical History: Patient Active Problem List   Diagnosis Date Noted   Asthma 03/13/2022   Food intolerance 03/13/2022   Heartburn 03/13/2022   Hip flexor tightness, left 03/31/2017   Scapular dyskinesis 03/31/2017   Nonallopathic lesion of thoracic region 03/31/2017   Nonallopathic lesion of lumbosacral region 03/31/2017   Nonallopathic lesion of sacral region 03/31/2017   Past Medical History:  Diagnosis Date   Asthma    exercise enduced   Urticaria    Past Surgical History: History reviewed. No pertinent surgical history. Medication List:  No current outpatient medications on file.   No current  facility-administered medications for this visit.   Allergies: Not on File Social History: Social History   Socioeconomic History   Marital status: Divorced    Spouse name: Not on file   Number of children: Not on file   Years of education: Not on file   Highest education level: Not on file  Occupational History  Not on file  Tobacco Use   Smoking status: Never    Passive exposure: Current   Smokeless tobacco: Never  Vaping Use   Vaping Use: Never used  Substance and Sexual Activity   Alcohol use: Never   Drug use: Never   Sexual activity: Not on file  Other Topics Concern   Not on file  Social History Narrative   Not on file   Social Determinants of Health   Financial Resource Strain: Not on file  Food Insecurity: Not on file  Transportation Needs: Not on file  Physical Activity: Not on file  Stress: Not on file  Social Connections: Not on file   Lives in a townhome. Smoking: denies Occupation: support spec  Environmental HistoryFreight forwarder in the house: no Carpet in the family room: no Carpet in the bedroom: yes Heating: gas Cooling: central Pet: yes 1 cat  x 12 yrs  Family History: History reviewed. No pertinent family history. Problem                               Relation Asthma                                   no Eczema                                no Food allergy                          no Allergic rhino conjunctivitis     no  Review of Systems  Constitutional:  Positive for unexpected weight change (20 lbs weight loss due to dietary changes). Negative for appetite change, chills and fever.  HENT:  Negative for congestion and rhinorrhea.   Eyes:  Negative for itching.  Respiratory:  Negative for cough, chest tightness, shortness of breath and wheezing.   Cardiovascular:  Negative for chest pain.  Gastrointestinal:  Positive for abdominal pain and nausea. Negative for vomiting.  Genitourinary:  Negative for difficulty urinating.   Skin:  Positive for rash.  Neurological:  Negative for headaches.    Objective: BP 122/62   Pulse 66   Temp 98.4 F (36.9 C) (Temporal)   Resp 16   Ht _0  (1.778 m)   Wt 196 lb 12.8 oz (89.3 kg)   SpO2 96%   BMI 28.24 kg/m  Body mass index is 28.24 kg/m. Physical Exam Vitals and nursing note reviewed.  Constitutional:      Appearance: Normal appearance. He is well-developed.  HENT:     Head: Normocephalic and atraumatic.     Right Ear: Tympanic membrane and external ear normal.     Left Ear: Tympanic membrane and external ear normal.     Nose: Nose normal.     Mouth/Throat:     Mouth: Mucous membranes are moist.     Pharynx: Oropharynx is clear.  Eyes:     Conjunctiva/sclera: Conjunctivae normal.  Cardiovascular:     Rate and Rhythm: Normal rate and regular rhythm.     Heart sounds: Normal heart sounds. No murmur heard.    No friction rub. No gallop.  Pulmonary:     Effort: Pulmonary effort is normal.     Breath sounds: Normal breath sounds. No wheezing,  rhonchi or rales.  Musculoskeletal:     Cervical back: Neck supple.  Skin:    General: Skin is warm.     Findings: No rash.  Neurological:     Mental Status: He is alert and oriented to person, place, and time.  Psychiatric:        Behavior: Behavior normal.   The plan was reviewed with the patient/family, and all questions/concerned were addressed.  It was my pleasure to see Fuller today and participate in his care. Please feel free to contact me with any questions or concerns.  Sincerely,  Rexene Alberts, DO Allergy & Immunology  [facility] office: [place] office: [phone]

## 2022-03-13 ENCOUNTER — Encounter: Payer: Self-pay | Admitting: Allergy

## 2022-03-13 ENCOUNTER — Ambulatory Visit (INDEPENDENT_AMBULATORY_CARE_PROVIDER_SITE_OTHER): Payer: 59 | Admitting: Allergy

## 2022-03-13 VITALS — BP 122/62 | HR 66 | Temp 98.4°F | Resp 16 | Ht 70.0 in | Wt 196.8 lb

## 2022-03-13 DIAGNOSIS — R12 Heartburn: Secondary | ICD-10-CM | POA: Insufficient documentation

## 2022-03-13 DIAGNOSIS — K9049 Malabsorption due to intolerance, not elsewhere classified: Secondary | ICD-10-CM

## 2022-03-13 DIAGNOSIS — J45909 Unspecified asthma, uncomplicated: Secondary | ICD-10-CM | POA: Insufficient documentation

## 2022-03-13 DIAGNOSIS — T781XXD Other adverse food reactions, not elsewhere classified, subsequent encounter: Secondary | ICD-10-CM

## 2022-03-13 NOTE — Patient Instructions (Addendum)
Today's skin testing showed: Negative to food panel.   Results given.  Discussed with patient hat skin prick testing and bloodwork (food IgE levels) check for IgE mediated reactions which his clinical presentation does not really support.  Keep a food journal with symptoms and foods eaten. Avoid foods that are bothersome. Recommend GI evaluation next - referral placed.  Possible heartburn: See handout for lifestyle and dietary modifications. May take omeprazole as needed.   Skin See below for proper skin care.  Follow up if needed.   Skin care recommendations  Bath time: Always use lukewarm water. AVOID very hot or cold water. Keep bathing time to 5-10 minutes. Do NOT use bubble bath. Use a mild soap and use just enough to wash the dirty areas. Do NOT scrub skin vigorously.  After bathing, pat dry your skin with a towel. Do NOT rub or scrub the skin.  Moisturizers and prescriptions:  ALWAYS apply moisturizers immediately after bathing (within 3 minutes). This helps to lock-in moisture. Use the moisturizer several times a day over the whole body. Good summer moisturizers include: Aveeno, CeraVe, Cetaphil. Good winter moisturizers include: Aquaphor, Vaseline, Cerave, Cetaphil, Eucerin, Vanicream. When using moisturizers along with medications, the moisturizer should be applied about one hour after applying the medication to prevent diluting effect of the medication or moisturize around where you applied the medications. When not using medications, the moisturizer can be continued twice daily as maintenance.  Laundry and clothing: Avoid laundry products with added color or perfumes. Use unscented hypo-allergenic laundry products such as Tide free, Cheer free & gentle, and All free and clear.  If the skin still seems dry or sensitive, you can try double-rinsing the clothes. Avoid tight or scratchy clothing such as wool. Do not use fabric softeners or dyer sheets.

## 2022-03-13 NOTE — Assessment & Plan Note (Signed)
   See handout for lifestyle and dietary modifications.  May take omeprazole as needed.

## 2022-03-13 NOTE — Assessment & Plan Note (Addendum)
Patient noted skin irritation, changes in bowel habits, headaches for the past 5+ years which seems to be worsening. Usually occurs within 1 hour of ingestion but can lasts for days. Current diet mainly consists of meats, eggs, salad, olive oil and fermented vegetables. No prior allergy testing or GI evaluation.   Discussed with patient that skin prick testing and bloodwork (food IgE levels) check for IgE mediated reactions which his clinical presentation does not really support.   Today's skin testing was negative to food panel.   Keep a food journal with symptoms and foods eaten.  Avoid foods that are bothersome.  Recommend GI evaluation next - referral placed.

## 2022-03-31 ENCOUNTER — Telehealth: Payer: Self-pay

## 2022-03-31 NOTE — Telephone Encounter (Signed)
-----   Message from Donnamae Jude, [place] sent at 03/16/2022  1:19 PM EDT ----- Regarding: Referal To GI  ----- Message ----- From: Garnet Sierras, DO Sent: 03/13/2022   5:29 PM EDT To: [facility]  Please place referral to GI - rule out IBS/IBD and Crohn's. Thank you.

## 2022-04-01 ENCOUNTER — Encounter: Payer: Self-pay | Admitting: Gastroenterology

## 2022-05-14 ENCOUNTER — Encounter: Payer: Self-pay | Admitting: Gastroenterology

## 2022-05-14 ENCOUNTER — Other Ambulatory Visit (INDEPENDENT_AMBULATORY_CARE_PROVIDER_SITE_OTHER): Payer: 59

## 2022-05-14 ENCOUNTER — Ambulatory Visit (INDEPENDENT_AMBULATORY_CARE_PROVIDER_SITE_OTHER): Payer: 59 | Admitting: Gastroenterology

## 2022-05-14 VITALS — BP 118/78 | HR 68 | Ht 71.0 in | Wt 195.0 lb

## 2022-05-14 DIAGNOSIS — K9049 Malabsorption due to intolerance, not elsewhere classified: Secondary | ICD-10-CM

## 2022-05-14 LAB — CBC WITH DIFFERENTIAL/PLATELET
Basophils Absolute: 0 10*3/uL (ref 0.0–0.1)
Basophils Relative: 0.4 % (ref 0.0–3.0)
Eosinophils Absolute: 0 10*3/uL (ref 0.0–0.7)
Eosinophils Relative: 0.3 % (ref 0.0–5.0)
HCT: 45.8 % (ref 39.0–52.0)
Hemoglobin: 15.6 g/dL (ref 13.0–17.0)
Lymphocytes Relative: 22.8 % (ref 12.0–46.0)
Lymphs Abs: 1.3 10*3/uL (ref 0.7–4.0)
MCHC: 34.1 g/dL (ref 30.0–36.0)
MCV: 87.8 fl (ref 78.0–100.0)
Monocytes Absolute: 0.5 10*3/uL (ref 0.1–1.0)
Monocytes Relative: 8.1 % (ref 3.0–12.0)
Neutro Abs: 3.9 10*3/uL (ref 1.4–7.7)
Neutrophils Relative %: 68.4 % (ref 43.0–77.0)
Platelets: 196 10*3/uL (ref 150.0–400.0)
RBC: 5.22 Mil/uL (ref 4.22–5.81)
RDW: 13.6 % (ref 11.5–15.5)
WBC: 5.7 10*3/uL (ref 4.0–10.5)

## 2022-05-14 NOTE — Progress Notes (Signed)
HPI : Ricardo Alexander is a very pleasant 36 year old male with a history of exercise-induced asthma and urticaria who is referred to [place] by his Allergist, Dr. Rexene Alberts for further evaluation of multiple food intolerances.  He reports having longstanding issues with gluten and dairy, but in the last 5 years has had increasing intolerances to many other foods, to the point where currently he only eats meats, rice and a few select vegetables.  The symptoms that he experiences when he consumes intolerant foods are atypical.  The patient primarily experiences itching of his skin along the scalp and beard area as well as sinus pressure headaches and what he calls 'digestive disruption'.  When asked to clarify what digestive disruption means, he reports irregular bowel habits and stool consistency.  He states that the itching usually starts within 30 minutes to an hour and will sometimes take days to resolve if he continues to eat the wrong foods. His allergist performed a food panel skin testing which was negative.   He denies having significant abdominal pain, nausea/vomiting or profuse diarrhea in response to eating these foods.  He denies symptoms such as welts, hives or facial swelling.  He does report on 1 occasion about 5 years ago he ate some [place] and broke out in a blotchy, diffuse erythematous rash.  He did not have itching or other systemic symptoms.  This is the only time he has had anything that looks like hives. He reports most significant symptoms with eating gluten, starches and dairy. He occasionally has atypical symptoms of GERD to include globus sensation and nausea.  He cut out certain cereals, and states that this seemed to have resolved the symptoms.  He had been prescribed omeprazole, but no longer takes it because he does not need it. As long as he is avoiding his problem foods, he does not have any sort of GI symptoms.  He has regular bowel movements with formed,  soft stools.  No problems with diarrhea, urgency, abdominal pain or blood in the stool. He has had rare episodes of dysphagia manifested by feeling like food is sitting in his chest when he swallows.  Currently the only vegetables that he eats without symptoms is lettuce, carrots and cucumbers.  He does not eat any fruit anymore. The note from his allergist indicates that labs performed by his primary provider were unremarkable except for a low vitamin D level.  Normal labs included CRP, ANA, CBC, B12, ESR and CMP His weight has been stable.    Patient has no history of eczema.  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  He denies any family history of atopy or celiac disease.  Past Medical History:  Diagnosis Date   Asthma    exercise enduced   Urticaria      No past surgical history on file. No family history on file. Social History   Tobacco Use   Smoking status: Never    Passive exposure: Current   Smokeless tobacco: Never  Vaping Use   Vaping Use: Never used  Substance Use Topics   Alcohol use: Never   Drug use: Never   No current outpatient medications on file.   No current facility-administered medications for this visit.   Not on File   Review of Systems: All systems reviewed and negative except where noted in HPI.    No results found.  Physical Exam: BP 118/78   Pulse 68   Ht _0  (  1.803 m)   Wt 195 lb (88.5 kg)   BMI 27.20 kg/m  Constitutional: Pleasant,well-developed, Caucasian male in no acute distress. HEENT: Normocephalic and atraumatic. Conjunctivae are normal. No scleral icterus. Neck supple.  Cardiovascular: Normal rate, regular rhythm.  Pulmonary/chest: Effort normal and breath sounds normal. No wheezing, rales or rhonchi. Abdominal: Soft, nondistended, nontender. Bowel sounds active throughout. There are no masses palpable. No hepatomegaly. Extremities: no edema Neurological: Alert and oriented to person place and time. Skin: Skin  is warm and dry. No rashes noted. Psychiatric: Normal mood and affect. Behavior is normal.  CBC    Component Value Date/Time   WBC 4.4 03/31/2017 1022   RBC 5.77 03/31/2017 1022   HGB 17.1 (H) 03/31/2017 1022   HCT 50.6 03/31/2017 1022   PLT 161.0 03/31/2017 1022   MCV 87.7 03/31/2017 1022   MCHC 33.7 03/31/2017 1022   RDW 12.8 03/31/2017 1022   LYMPHSABS 1.6 03/31/2017 1022   MONOABS 0.3 03/31/2017 1022   EOSABS 0.1 03/31/2017 1022   BASOSABS 0.0 03/31/2017 1022    CMP     Component Value Date/Time   NA 138 03/31/2017 1022   K 3.9 03/31/2017 1022   CL 102 03/31/2017 1022   CO2 27 03/31/2017 1022   GLUCOSE 100 (H) 03/31/2017 1022   BUN 21 03/31/2017 1022   CREATININE 1.08 03/31/2017 1022   CALCIUM 9.9 03/31/2017 1022   CALCIUM 9.7 03/31/2017 1022   PROT 7.3 03/31/2017 1022   ALBUMIN 4.5 03/31/2017 1022   AST 22 03/31/2017 1022   ALT 24 03/31/2017 1022   ALKPHOS 105 03/31/2017 1022   BILITOT 0.7 03/31/2017 1022     ASSESSMENT AND PLAN: 36 year old male with several years of atypical food intolerance symptoms, manifested primarily by itching of his skin in the absence of hives as well as sinus pressure headaches and vague changes in his bowel habits, without prominent GI symptoms to include abdominal pain, nausea/vomiting or profuse diarrhea.  Food allergen panel negative by his allergist.  I did not see if that he has been tested for celiac disease. Overall, I feel is very unlikely that we will find any objective abnormalities to explain his unusual symptoms to all these various foods.  I think an upper endoscopy with biopsies of the stomach, duodenum and esophagus is not unreasonable to more definitively exclude eosinophilic disorders.  Will also screen for celiac disease with TTG/IgA and get a repeat CBC with differential (I do not have this available for review). Given his extremely restrictive diet, he may benefit from a nutrition referral to analyze his diet and  provide any recommendations to avoid nutritional deficiencies.  Food intolerance - EGD - CBC, tTG/IgA  The details, risks (including bleeding, perforation, infection, missed lesions, medication reactions and possible hospitalization or surgery if complications occur), benefits, and alternatives to EGD with possible biopsy and possible dilation were discussed with the patient and he consents to proceed.   Kooper Godshall E. Candis Schatz, MD [facility]  Garnet Sierras, DO

## 2022-05-14 NOTE — Patient Instructions (Signed)
_______________________________________________________  If you are age 35 or older, your body mass index should be between 23-30. Your Body mass index is 27.2 kg/m. If this is out of the aforementioned range listed, please consider follow up with your Primary Care Provider.  If you are age 80 or younger, your body mass index should be between 19-25. Your Body mass index is 27.2 kg/m. If this is out of the aformentioned range listed, please consider follow up with your Primary Care Provider.   You have been scheduled for an endoscopy. Please follow written instructions given to you at your visit today. If you use inhalers (even only as needed), please bring them with you on the day of your procedure.  Your provider has requested that you go to the basement level for lab work before leaving today. Press "B" on the elevator. The lab is located at the first door on the left as you exit the elevator.   The [facility] providers would like to encourage you to use [facility] to communicate with providers for non-urgent requests or questions.  Due to long hold times on the telephone, sending your provider a message by [facility] may be a faster and more efficient way to get a response.  Please allow 48 business hours for a response.  Please remember that this is for non-urgent requests.   It was a pleasure to see you today!  Thank you for trusting me with your gastrointestinal care!    Scott E. Candis Schatz, MD

## 2022-05-15 LAB — IGA: Immunoglobulin A: 169 mg/dL (ref 47–310)

## 2022-05-15 LAB — TISSUE TRANSGLUTAMINASE, IGA: (tTG) Ab, IgA: <1 U/mL

## 2022-05-20 NOTE — Progress Notes (Signed)
Ricardo Alexander,  Your labs looked good.  No evidence of celiac disease and no eosinophilia in your blood counts

## 2022-06-04 ENCOUNTER — Ambulatory Visit (AMBULATORY_SURGERY_CENTER): Payer: 59 | Admitting: Gastroenterology

## 2022-06-04 ENCOUNTER — Encounter: Payer: Self-pay | Admitting: Gastroenterology

## 2022-06-04 VITALS — BP 99/58 | HR 62 | Temp 98.2°F | Resp 12 | Ht 71.0 in | Wt 195.0 lb

## 2022-06-04 DIAGNOSIS — K2100 Gastro-esophageal reflux disease with esophagitis, without bleeding: Secondary | ICD-10-CM

## 2022-06-04 DIAGNOSIS — C162 Malignant neoplasm of body of stomach: Secondary | ICD-10-CM | POA: Diagnosis not present

## 2022-06-04 DIAGNOSIS — D131 Benign neoplasm of stomach: Secondary | ICD-10-CM

## 2022-06-04 DIAGNOSIS — R1310 Dysphagia, unspecified: Secondary | ICD-10-CM | POA: Diagnosis not present

## 2022-06-04 DIAGNOSIS — K9049 Malabsorption due to intolerance, not elsewhere classified: Secondary | ICD-10-CM

## 2022-06-04 DIAGNOSIS — C163 Malignant neoplasm of pyloric antrum: Secondary | ICD-10-CM | POA: Diagnosis not present

## 2022-06-04 MED ORDER — SODIUM CHLORIDE 0.9 % IV SOLN: 500.0000 mL | Freq: Once | INTRAVENOUS | Status: DC

## 2022-06-04 NOTE — Patient Instructions (Addendum)
Resume previous diet and continue present medications. Await pathology results. Further recommendations pending pathology results.   YOU HAD AN ENDOSCOPIC PROCEDURE TODAY AT [facility]:   Refer to the procedure report that was given to you for any specific questions about what was found during the examination.  If the procedure report does not answer your questions, please call your gastroenterologist to clarify.  If you requested that your care partner not be given the details of your procedure findings, then the procedure report has been included in a sealed envelope for you to review at your convenience later.  YOU SHOULD EXPECT: Some feelings of bloating in the abdomen. Passage of more gas than usual.  Walking can help get rid of the air that was put into your GI tract during the procedure and reduce the bloating. If you had a lower endoscopy (such as a colonoscopy or flexible sigmoidoscopy) you may notice spotting of blood in your stool or on the toilet paper. If you underwent a bowel prep for your procedure, you may not have a normal bowel movement for a few days.  Please Note:  You might notice some irritation and congestion in your nose or some drainage.  This is from the oxygen used during your procedure.  There is no need for concern and it should clear up in a day or so.  SYMPTOMS TO REPORT IMMEDIATELY:   Following upper endoscopy (EGD)  Vomiting of blood or coffee ground material  New chest pain or pain under the shoulder blades  Painful or persistently difficult swallowing  New shortness of breath  Fever of 100F or higher  Black, tarry-looking stools  For urgent or emergent issues, a gastroenterologist can be reached at any hour by calling [phone]. Do not use MyChart messaging for urgent concerns.    DIET:  We do recommend a small meal at first, but then you may proceed to your regular diet.  Drink plenty of fluids but you should avoid alcoholic  beverages for 24 hours.  ACTIVITY:  You should plan to take it easy for the rest of today and you should NOT DRIVE or use heavy machinery until tomorrow (because of the sedation medicines used during the test).    FOLLOW UP: Our staff will call the number listed on your records the next business day following your procedure.  We will call around 7:15- 8:00 am to check on you and address any questions or concerns that you may have regarding the information given to you following your procedure. If we do not reach you, we will leave a message.     If any biopsies were taken you will be contacted by phone or by letter within the next 1-3 weeks.  Please call us at [phone] if you have not heard about the biopsies in 3 weeks.    SIGNATURES/CONFIDENTIALITY: You and/or your care partner have signed paperwork which will be entered into your electronic medical record.  These signatures attest to the fact that that the information above on your After Visit Summary has been reviewed and is understood.  Full responsibility of the confidentiality of this discharge information lies with you and/or your care-partner.

## 2022-06-04 NOTE — Op Note (Signed)
[facility] Patient Name: Ricardo Alexander Procedure Date: 06/04/2022 3:38 PM MRN: [id number] Endoscopist: Nicki Reaper E. Candis Schatz , MD, [id number] Age: 36 Referring MD:  Date of Birth: 08/19/1985 Gender: Male Account #: [id number] Procedure:                Upper GI endoscopy Indications:              Dyspepsia, multiple food intolerances Medicines:                Monitored Anesthesia Care Procedure:                Pre-Anesthesia Assessment:                           - Prior to the procedure, a History and Physical                            was performed, and patient medications and                            allergies were reviewed. The patient's tolerance of                            previous anesthesia was also reviewed. The risks                            and benefits of the procedure and the sedation                            options and risks were discussed with the patient.                            All questions were answered, and informed consent                            was obtained. Prior Anticoagulants: The patient has                            taken no anticoagulant or antiplatelet agents. ASA                            Grade Assessment: II - A patient with mild systemic                            disease. After reviewing the risks and benefits,                            the patient was deemed in satisfactory condition to                            undergo the procedure.                           After obtaining informed consent, the endoscope was  passed under direct vision. Throughout the                            procedure, the patient's blood pressure, pulse, and                            oxygen saturations were monitored continuously. The                            Endoscope was introduced through the mouth, and                            advanced to the third part of duodenum. The upper                            GI endoscopy  was accomplished without difficulty.                            The patient tolerated the procedure well. Scope In: Scope Out: Findings:                 The examined portions of the nasopharynx,                            oropharynx and larynx were normal.                           Localized mild mucosal changes characterized by                            white specks were found in the lower third of the                            esophagus. Biopsies were obtained from the proximal                            and distal esophagus with cold forceps for                            histology of suspected eosinophilic esophagitis.                            Estimated blood loss was minimal.                           The exam of the esophagus was otherwise normal.                           The gastroesophageal flap valve was visualized                            endoscopically and classified as Hill Grade IV (no                            fold, wide  open lumen, hiatal hernia present).                           The entire examined stomach was normal. Biopsies                            were taken with a cold forceps for histology and                            for H. pylori testing. Estimated blood loss was                            minimal.                           The examined duodenum was normal. Biopsies for                            histology were taken with a cold forceps for                            evaluation of celiac disease and histology.                            Estimated blood loss was minimal. Complications:            No immediate complications. Estimated Blood Loss:     Estimated blood loss was minimal. Impression:               - The examined portions of the nasopharynx,                            oropharynx and larynx were normal.                           - White specked mucosa in the esophagus. Suspicious                            for possible candidiasis. Biopsied.                            - Gastroesophageal flap valve classified as Hill                            Grade IV (no fold, wide open lumen, hiatal hernia                            present).                           - Normal stomach. Biopsied.                           - Normal examined duodenum. Biopsied.                           -  Biopsies were taken with a cold forceps for                            evaluation of eosinophilic esophagitis.                           - No obvious endoscopic abnormalities to explain                            patient's numerous food intolerances. Recommendation:           - Patient has a contact number available for                            emergencies. The signs and symptoms of potential                            delayed complications were discussed with the                            patient. Return to normal activities tomorrow.                            Written discharge instructions were provided to the                            patient.                           - Resume previous diet.                           - Continue present medications.                           - Await pathology results.                           - Further recommendations based on pathology                            results. Jady Braggs E. Candis Schatz, MD 06/04/2022 4:10:45 PM This report has been signed electronically.

## 2022-06-04 NOTE — Progress Notes (Signed)
Pt's states no medical or surgical changes since previsit or office visit. VS assessed by D.T 

## 2022-06-04 NOTE — Progress Notes (Signed)
History and Physical Interval Note:  06/04/2022 3:45 PM  Ricardo Alexander  has presented today for endoscopic procedure(s), with the diagnosis of  Encounter Diagnosis  Name Primary?   Food intolerance Yes  .  The various methods of evaluation and treatment have been discussed with the patient and/or family. After consideration of risks, benefits and other options for treatment, the patient has consented to  the endoscopic procedure(s).   The patient's history has been reviewed, patient examined, no change in status, stable for endoscopic procedure(s).  I have reviewed the patient's chart and labs.  Questions were answered to the patient's satisfaction.     Randa Riss E. Candis Schatz, MD [facility]

## 2022-06-04 NOTE — Progress Notes (Signed)
Report to PACU, RN, vss, BBS= Clear.  

## 2022-06-04 NOTE — Progress Notes (Signed)
Called to room to assist during endoscopic procedure.  Patient ID and intended procedure confirmed with present staff. Received instructions for my participation in the procedure from the performing physician.  

## 2022-06-05 ENCOUNTER — Telehealth: Payer: Self-pay | Admitting: *Deleted

## 2022-06-05 NOTE — Telephone Encounter (Signed)
Post procedure follow up call placed, no answer and left VM.  

## 2022-06-11 ENCOUNTER — Other Ambulatory Visit: Payer: Self-pay | Admitting: Surgery

## 2022-06-17 NOTE — Progress Notes (Signed)
Ricardo Alexander,  The biopsies of your esophagus showed changes suggestive of mild acid reflux.  These findings are likely of limited significance, as your symptoms are very unlikely to be related to GERD. The biopsies of your stomach and duodenum were unremarkable.  There was no evidence of inflammation or increased eosinophils. Please follow up with me in the clinic to discuss further management of your symptoms.

## 2022-06-20 ENCOUNTER — Emergency Department (HOSPITAL_COMMUNITY): Payer: 59

## 2022-06-20 ENCOUNTER — Encounter (HOSPITAL_COMMUNITY): Payer: Self-pay | Admitting: Emergency Medicine

## 2022-06-20 ENCOUNTER — Emergency Department (HOSPITAL_COMMUNITY)
Admission: EM | Admit: 2022-06-20 | Discharge: 2022-06-21 | Disposition: A | Discharge status: Home / Self Care | Payer: 59 | Attending: Emergency Medicine | Admitting: Emergency Medicine

## 2022-06-20 ENCOUNTER — Other Ambulatory Visit: Payer: Self-pay

## 2022-06-20 DIAGNOSIS — F1290 Cannabis use, unspecified, uncomplicated: Secondary | ICD-10-CM | POA: Insufficient documentation

## 2022-06-20 DIAGNOSIS — R002 Palpitations: Secondary | ICD-10-CM | POA: Insufficient documentation

## 2022-06-20 DIAGNOSIS — E860 Dehydration: Secondary | ICD-10-CM | POA: Insufficient documentation

## 2022-06-20 DIAGNOSIS — F419 Anxiety disorder, unspecified: Secondary | ICD-10-CM | POA: Insufficient documentation

## 2022-06-20 DIAGNOSIS — J45909 Unspecified asthma, uncomplicated: Secondary | ICD-10-CM | POA: Diagnosis not present

## 2022-06-20 DIAGNOSIS — E876 Hypokalemia: Secondary | ICD-10-CM | POA: Insufficient documentation

## 2022-06-20 DIAGNOSIS — R0789 Other chest pain: Secondary | ICD-10-CM | POA: Diagnosis not present

## 2022-06-20 LAB — BASIC METABOLIC PANEL
Anion gap: 9 (ref 5–15)
BUN: 27 mg/dL — ABNORMAL HIGH (ref 6–20)
CO2: 23 mmol/L (ref 22–32)
Calcium: 8.9 mg/dL (ref 8.9–10.3)
Chloride: 102 mmol/L (ref 98–111)
Creatinine, Ser: 1.3 mg/dL — ABNORMAL HIGH (ref 0.61–1.24)
GFR, Estimated: >60 mL/min (ref >60)
Glucose, Bld: 145 mg/dL — ABNORMAL HIGH (ref 70–99)
Potassium: 3.2 mmol/L — ABNORMAL LOW (ref 3.5–5.1)
Sodium: 134 mmol/L — ABNORMAL LOW (ref 135–145)

## 2022-06-20 LAB — TROPONIN I (HIGH SENSITIVITY): Troponin I (High Sensitivity): 7 ng/L (ref <18)

## 2022-06-20 LAB — CBC
HCT: 44.5 % (ref 39.0–52.0)
Hemoglobin: 15.1 g/dL (ref 13.0–17.0)
MCH: 30 pg (ref 26.0–34.0)
MCHC: 33.9 g/dL (ref 30.0–36.0)
MCV: 88.3 fL (ref 80.0–100.0)
Platelets: 170 10*3/uL (ref 150–400)
RBC: 5.04 MIL/uL (ref 4.22–5.81)
RDW: 12.3 % (ref 11.5–15.5)
WBC: 5.4 10*3/uL (ref 4.0–10.5)
nRBC: 0 % (ref 0.0–0.2)

## 2022-06-20 NOTE — ED Triage Notes (Signed)
Pt consumed THC gummies today at 6p and 8:30p, did not realize the concentration was twice as high as he thought. Now experiencing chest pain, SOB, palpitations. Denies trouble swallowing, hives.

## 2022-06-21 LAB — TROPONIN I (HIGH SENSITIVITY): Troponin I (High Sensitivity): 6 ng/L (ref <18)

## 2022-06-21 LAB — TSH: TSH: 2.649 u[IU]/mL (ref 0.350–4.500)

## 2022-06-21 MED ORDER — POTASSIUM CHLORIDE CRYS ER 20 MEQ PO TBCR
40.0000 meq | EXTENDED_RELEASE_TABLET | Freq: Once | ORAL | Status: AC
  Administered 2022-06-21: 40 meq via ORAL
  Filled 2022-06-21: qty 2

## 2022-06-21 NOTE — ED Notes (Signed)
IV removed. Pt verbalized understanding of discharge instructions. Pt ambulated from ED with steady gait.

## 2022-06-21 NOTE — Discharge Instructions (Addendum)
Please refrain from [facility] use in the future  Please increase your water intake over the next few days  Your thyroid hormone tests will result on MyChart, please follow-up with your PCP in regards to this and your visit today in the emergency department  It was a pleasure caring for you today in the emergency department.  Please return to the emergency department for any worsening or worrisome symptoms.

## 2022-06-21 NOTE — ED Provider Notes (Signed)
[name] DEPT Provider Note  CSN: [id number] Arrival date & time: 06/20/22 2228  Chief Complaint(s) Palpitations  HPI Ricardo Alexander is a 37 y.o. male with past medical history as below, significant for anxiety, asthma, depression  who presents to the ED with complaint of thc ingestion. First time THC use, around 1800 last night. Felt chest tightness, palpitations.  Thought he was having a panic attack.  No other coingestions.  No history of prior THC use.  Currently he is feeling much better.  No nausea or vomiting, no chest pain or dyspnea, no longer having palpitations.  Nonsuicidal.  Feels like his anxiety is improving.  No other complaints offered  Past Medical History Past Medical History:  Diagnosis Date   Anxiety    Asthma    exercise enduced   Asthma    Chronic headaches    Depression    Sleep apnea    Urticaria    Patient Active Problem List   Diagnosis Date Noted   Asthma 03/13/2022   Food intolerance 03/13/2022   Heartburn 03/13/2022   Hip flexor tightness, left 03/31/2017   Scapular dyskinesis 03/31/2017   Nonallopathic lesion of thoracic region 03/31/2017   Nonallopathic lesion of lumbosacral region 03/31/2017   Nonallopathic lesion of sacral region 03/31/2017   Home Medication(s) Prior to Admission medications   Medication Sig Start Date End Date Taking? Authorizing Provider  albuterol (VENTOLIN HFA) 108 (90 Base) MCG/ACT inhaler Inhale into the lungs every 6 (six) hours as needed for wheezing or shortness of breath.    [provider]                                                                                                                                    Past Surgical History Past Surgical History:  Procedure Laterality Date   HERNIA REPAIR     Family History Family History  Problem Relation Age of Onset   Diabetes Maternal Grandfather    Colon cancer Neg Hx    Rectal cancer Neg Hx    Stomach cancer Neg  Hx    Esophageal cancer Neg Hx     Social History Social History   Tobacco Use   Smoking status: Never    Passive exposure: Current   Smokeless tobacco: Never  Vaping Use   Vaping Use: Never used  Substance Use Topics   Alcohol use: Never   Drug use: Never   Allergies Patient has no known allergies.  Review of Systems Review of Systems  Constitutional:  Negative for chills and fever.  HENT:  Negative for facial swelling and trouble swallowing.   Eyes:  Negative for photophobia and visual disturbance.  Respiratory:  Positive for chest tightness. Negative for cough and shortness of breath.   Cardiovascular:  Positive for palpitations. Negative for chest pain.  Gastrointestinal:  Negative for abdominal pain, nausea and vomiting.  Endocrine: Negative for polydipsia and polyuria.  Genitourinary:  Negative for difficulty urinating and hematuria.  Musculoskeletal:  Negative for gait problem and joint swelling.  Skin:  Negative for pallor and rash.  Neurological:  Negative for syncope and headaches.  Psychiatric/Behavioral:  Negative for agitation and confusion. The patient is nervous/anxious.     Physical Exam Vital Signs  I have reviewed the triage vital signs BP 118/65   Pulse 72   Temp 98.6 F (37 C) (Oral)   Resp (!) 21   Wt 88 kg   SpO2 97%   BMI 27.06 kg/m  Physical Exam Vitals and nursing note reviewed.  Constitutional:      General: He is not in acute distress.    Appearance: Normal appearance. He is well-developed. He is not ill-appearing, toxic-appearing or diaphoretic.  HENT:     Head: Normocephalic and atraumatic.     Right Ear: External ear normal.     Left Ear: External ear normal.     Mouth/Throat:     Mouth: Mucous membranes are moist.  Eyes:     General: No scleral icterus. Cardiovascular:     Rate and Rhythm: Normal rate and regular rhythm.     Pulses: Normal pulses.     Heart sounds: Normal heart sounds.     No S3 or S4 sounds.  Pulmonary:      Effort: Pulmonary effort is normal. No tachypnea or respiratory distress.     Breath sounds: Normal breath sounds. No stridor. No wheezing.  Abdominal:     General: Abdomen is flat.     Palpations: Abdomen is soft.     Tenderness: There is no abdominal tenderness.  Musculoskeletal:        General: Normal range of motion.     Cervical back: Normal range of motion.     Right lower leg: No edema.     Left lower leg: No edema.  Skin:    General: Skin is warm and dry.     Capillary Refill: Capillary refill takes less than 2 seconds.  Neurological:     Mental Status: He is alert and oriented to person, place, and time.  Psychiatric:        Mood and Affect: Mood normal.        Behavior: Behavior normal.     ED Results and Treatments Labs (all labs ordered are listed, but only abnormal results are displayed) Labs Reviewed  BASIC METABOLIC PANEL - Abnormal; Notable for the following components:      Result Value   Sodium 134 (*)    Potassium 3.2 (*)    Glucose, Bld 145 (*)    BUN 27 (*)    Creatinine, Ser 1.30 (*)    All other components within normal limits  CBC  TSH  TROPONIN I (HIGH SENSITIVITY)  TROPONIN I (HIGH SENSITIVITY)  Radiology DG Chest 2 View  Result Date: 06/20/2022 CLINICAL DATA:  Palpitations EXAM: CHEST - 2 VIEW COMPARISON:  None Available. FINDINGS: The heart size and mediastinal contours are within normal limits. Both lungs are clear. The visualized skeletal structures are unremarkable. IMPRESSION: No active cardiopulmonary disease. Electronically Signed   By: Rolm Baptise M.D.   On: 06/20/2022 23:22    Pertinent labs & imaging results that were available during my care of the patient were reviewed by me and considered in my medical decision making (see MDM for details).  Medications Ordered in ED Medications  potassium chloride SA  (KLOR-CON M) CR tablet 40 mEq (has no administration in time range)                                                                                                                                     Procedures Procedures  (including critical care time)  Medical Decision Making / ED Course   MDM:  Ricardo Alexander is a 37 y.o. male with past medical history as below, significant for anxiety, asthma, depression  who presents to the ED with complaint of thc ingestion. . The complaint involves an extensive differential diagnosis and also carries with it a high risk of complications and morbidity.  Serious etiology was considered. Ddx includes but is not limited to: Differential includes all life-threatening causes for chest pain. This includes but is not exclusive to acute coronary syndrome, aortic dissection, pulmonary embolism, cardiac tamponade, community-acquired pneumonia, pericarditis, musculoskeletal chest wall pain, ingestion etc.   On initial assessment the patient is: Sitting comfortably, his symptoms have improved significantly since the onset.  No current chest pain or palpitations.  Mild anxiety is still present Vital signs and nursing notes were reviewed    The patient's chest pain is not suggestive of pulmonary embolus, cardiac ischemia, aortic dissection, pericarditis, myocarditis, pulmonary embolism, pneumothorax, pneumonia, Zoster, or esophageal perforation, or other serious etiology.  Historically not abrupt in onset, tearing or ripping, pulses symmetric. EKG nonspecific for ischemia/infarction. No dysrhythmias, brugada, WPW, prolonged QT noted.   Troponin negative x2. CXR reviewed. Labs without demonstration of acute pathology unless otherwise noted above. Low HEART Score: 0-3 points (0.9-1.7% risk of MACE).  Symptoms today likely secondary to THC ingestion.  TSH was sent, patient will follow-up on MyChart.  Will replete potassium orally.  He is tolerant for p.o. intake without  difficulty  Given the extremely low risk of these diagnoses further testing and evaluation for these possibilities does not appear to be indicated at this time. Patient in no distress and overall condition improved here in the ED. Detailed discussions were had with the patient regarding current findings, and need for close f/u with PCP or on call doctor. The patient has been instructed to return immediately if the symptoms worsen in any way for re-evaluation. Patient verbalized understanding and is in agreement with current care plan. All questions answered prior  to discharge.   Additional history obtained: -Additional history obtained from spouse -External records from outside source obtained and reviewed including: Chart review including previous notes, labs, imaging, consultation notes including prior care notes, primary care documentation, prior labs and imaging, home medications   Lab Tests: -I ordered, reviewed, and interpreted labs.   The pertinent results include:   Labs Reviewed  BASIC METABOLIC PANEL - Abnormal; Notable for the following components:      Result Value   Sodium 134 (*)    Potassium 3.2 (*)    Glucose, Bld 145 (*)    BUN 27 (*)    Creatinine, Ser 1.30 (*)    All other components within normal limits  CBC  TSH  TROPONIN I (HIGH SENSITIVITY)  TROPONIN I (HIGH SENSITIVITY)    Notable for Trope negative, potassium mildly depleted, mild elevation of creatinine and BUN.  EKG   EKG Interpretation  Date/Time:  Saturday June 20 2022 22:35:03 EST Ventricular Rate:  94 PR Interval:  141 QRS Duration: 111 QT Interval:  389 QTC Calculation: 487 R Axis:   115 Text Interpretation: Sinus rhythm Probable right ventricular hypertrophy Interpretation limited secondary to artifact Confirmed by Wynona Dove ([id number]) on 06/21/2022 2:00:21 AM         Imaging Studies ordered: I ordered imaging studies including x-ray I independently visualized the following imaging with  scope of interpretation limited to determining acute life threatening conditions related to emergency care: No acute process I independently visualized and interpreted imaging. I agree with the radiologist interpretation   Medicines ordered and prescription drug management: Meds ordered this encounter  Medications   potassium chloride SA (KLOR-CON M) CR tablet 40 mEq    -I have reviewed the patients home medicines and have made adjustments as needed   Consultations Obtained: I requested consultation with the na,  and discussed lab and imaging findings as well as pertinent plan - they recommend: na   Cardiac Monitoring: The patient was maintained on a cardiac monitor.  I personally viewed and interpreted the cardiac monitored which showed an underlying rhythm of: nsr  Social Determinants of Health:  Diagnosis or treatment significantly limited by social determinants of health: former smoker Encouraged THC cessation  Reevaluation: After the interventions noted above, I reevaluated the patient and found that they have improved  Co morbidities that complicate the patient evaluation  Past Medical History:  Diagnosis Date   Anxiety    Asthma    exercise enduced   Asthma    Chronic headaches    Depression    Sleep apnea    Urticaria       Dispostion: Disposition decision including need for hospitalization was considered, and patient discharged from emergency department.    Final Clinical Impression(s) / ED Diagnoses Final diagnoses:  Anxiety  Marijuana use  Hypokalemia  Mild dehydration     This chart was dictated using voice recognition software.  Despite best efforts to proofread,  errors can occur which can change the documentation meaning.    Jeanell Sparrow, DO 06/21/22 0202

## 2022-06-21 NOTE — ED Notes (Signed)
Pt resting in stretcher. No signs of discomfort denies pain.

## 2022-11-30 IMAGING — US US THYROID
1 series · 14 of 25 positions shown · non-contrast
Comparison: None Available.

CLINICAL DATA: Palpable abnormality. Thyromegaly on physical
examination.

EXAM:
THYROID ULTRASOUND
TECHNIQUE: Ultrasound examination of the thyroid gland and adjacent soft
tissues was performed.

[Series 1: us thyroid · 0.05mm/px · 14 of 38 slices shown]
[im 1/38]
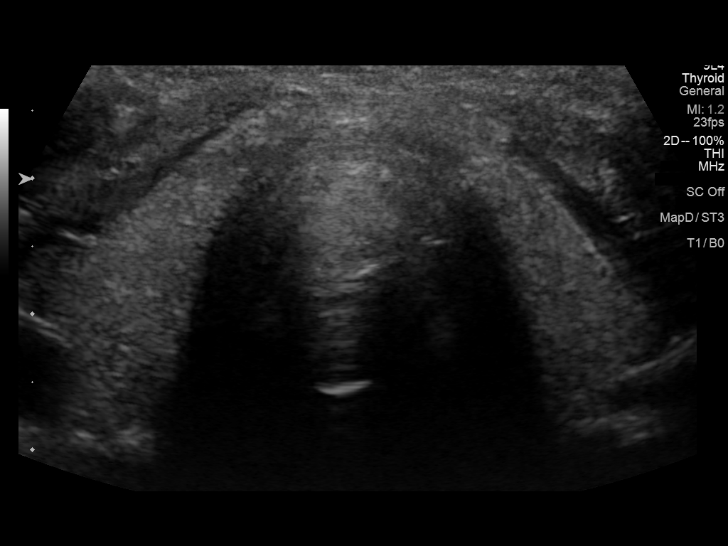
[im 4/38]
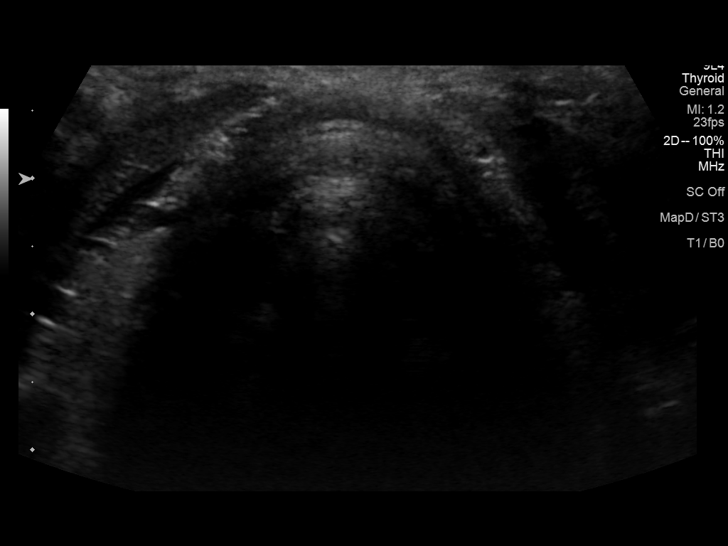
[im 7/38]
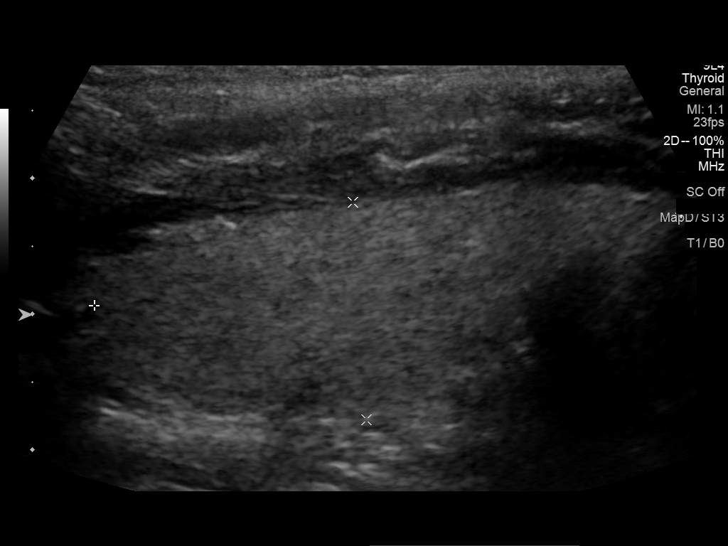
[im 10/38]
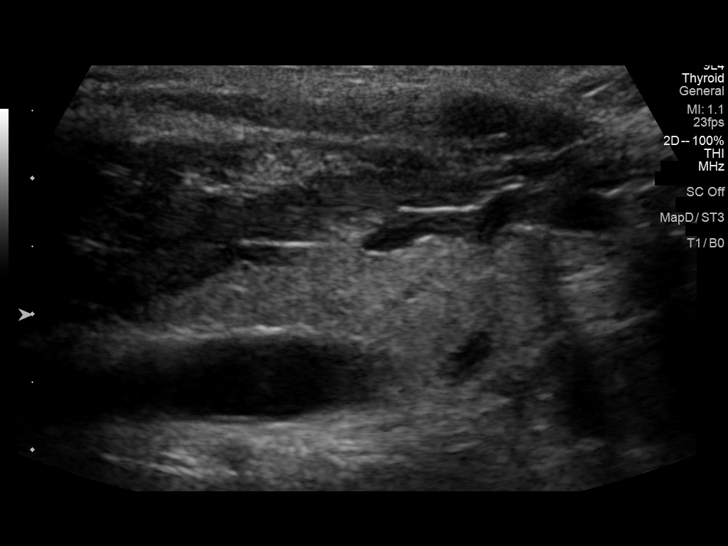
[im 13/38]
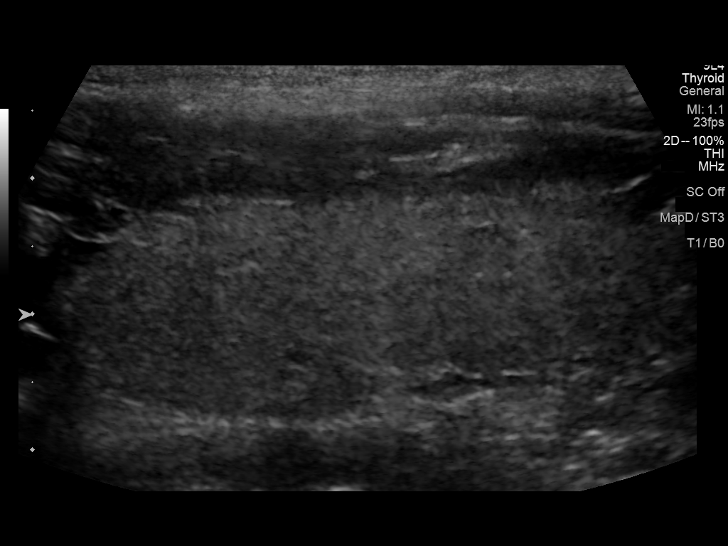
[im 14/38]
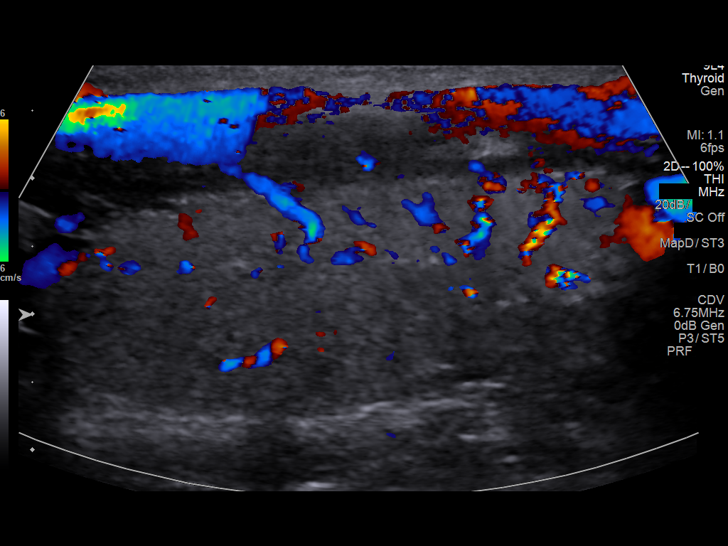
[im 17/38]
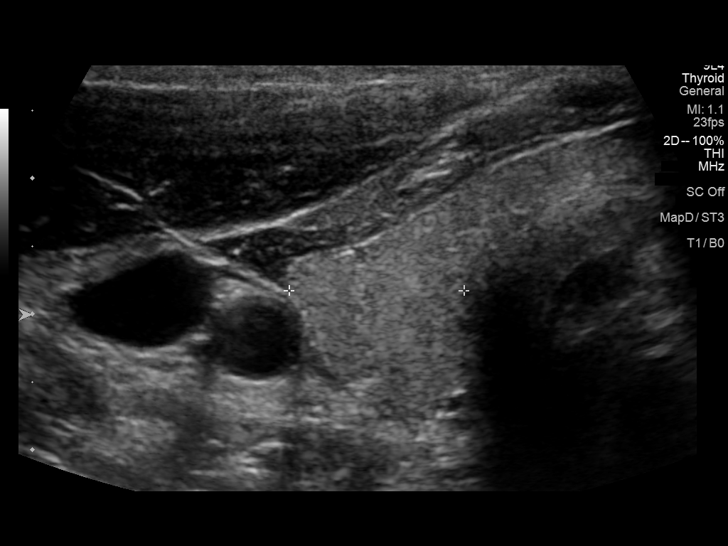
[im 21/38]
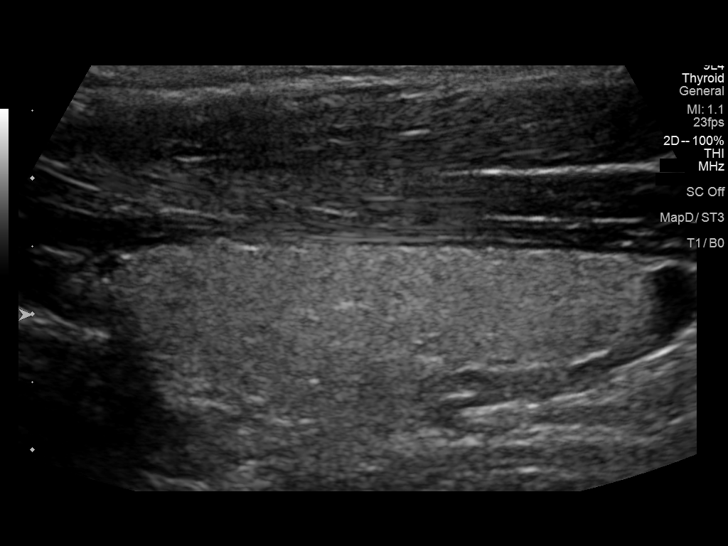
[im 24/38]
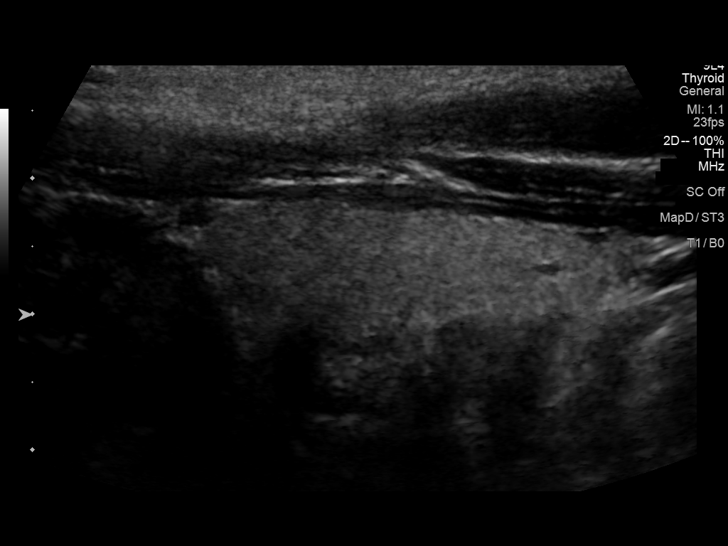
[im 25/38]
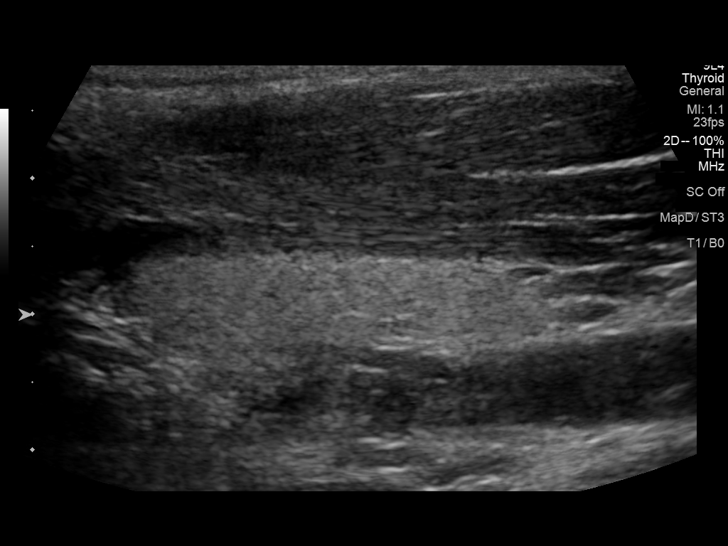
[im 28/38]
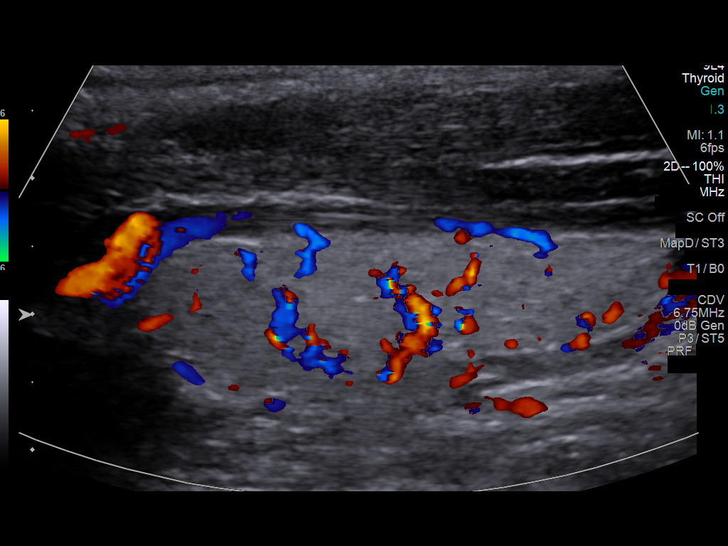
[im 31/38]
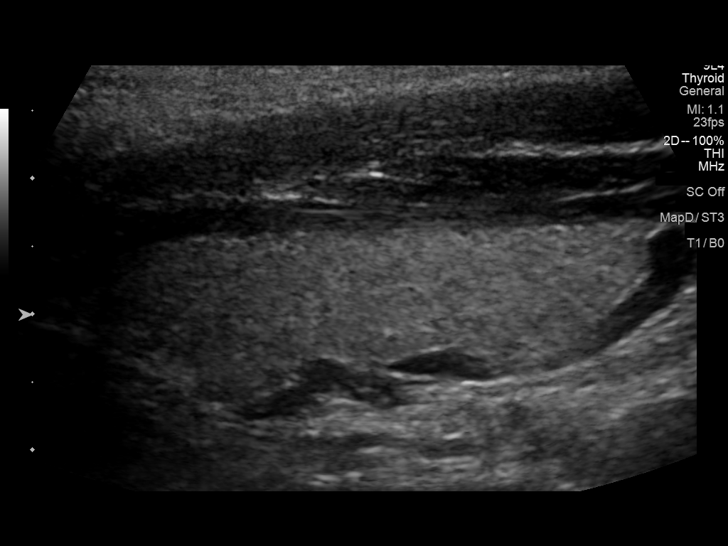
[im 34/38]
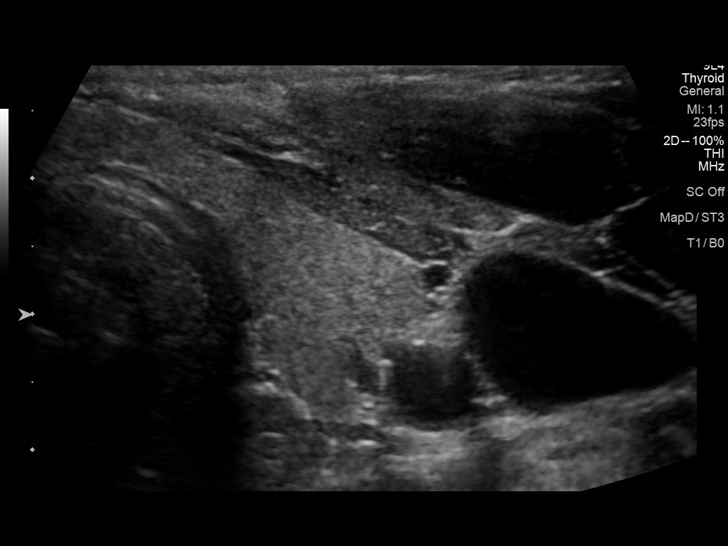
[im 38/38]
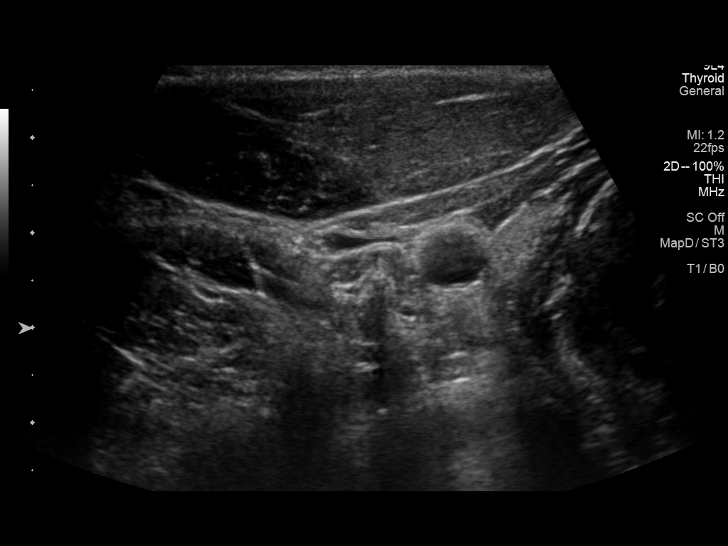

[14 of 25 positions shown; findings below may reference images not displayed]

FINDINGS: Parenchymal Echotexture: Normal

Isthmus: Normal in size measuring 0.3 cm in diameter

Right lobe: Normal in size measuring 4.4 x 1.6 x 1.3 cm

Left lobe: Normal in size measuring 3.9 x 1.4 x 1.3 cm

_________________________________________________________

Estimated total number of nodules >/= 1 cm: 0

Number of spongiform nodules >/=  2 cm not described below (TR1): 0

Number of mixed cystic and solid nodules >/= 1.5 cm not described
below (TR2): 0

_________________________________________________________

No discrete nodules are seen within the thyroid gland.
IMPRESSION: Normal thyroid ultrasound. Specifically, no evidence of thyromegaly
or discrete thyroid nodule or mass.

## 2023-04-28 ENCOUNTER — Ambulatory Visit (HOSPITAL_BASED_OUTPATIENT_CLINIC_OR_DEPARTMENT_OTHER)
Admission: RE | Admit: 2023-04-28 | Discharge: 2023-04-28 | Disposition: A | Discharge status: Home / Self Care | Payer: 59 | Source: Ambulatory Visit | Attending: Internal Medicine | Admitting: Internal Medicine

## 2023-04-28 ENCOUNTER — Encounter: Payer: Self-pay | Admitting: Internal Medicine

## 2023-04-28 ENCOUNTER — Other Ambulatory Visit (HOSPITAL_BASED_OUTPATIENT_CLINIC_OR_DEPARTMENT_OTHER): Payer: Self-pay | Admitting: Internal Medicine

## 2023-04-28 DIAGNOSIS — R748 Abnormal levels of other serum enzymes: Secondary | ICD-10-CM

## 2023-06-23 ENCOUNTER — Other Ambulatory Visit (HOSPITAL_COMMUNITY): Payer: Self-pay | Admitting: Gastroenterology

## 2023-06-23 DIAGNOSIS — R748 Abnormal levels of other serum enzymes: Secondary | ICD-10-CM

## 2023-06-28 ENCOUNTER — Encounter (HOSPITAL_COMMUNITY)
Admission: RE | Admit: 2023-06-28 | Discharge: 2023-06-28 | Disposition: A | Discharge status: Home / Self Care | Payer: 59 | Source: Ambulatory Visit | Attending: Gastroenterology | Admitting: Gastroenterology

## 2023-06-28 DIAGNOSIS — R748 Abnormal levels of other serum enzymes: Secondary | ICD-10-CM | POA: Diagnosis present

## 2023-06-28 MED ORDER — TECHNETIUM TC 99M MEDRONATE IV KIT
20.0000 | PACK | Freq: Once | INTRAVENOUS | Status: AC | PRN
  Administered 2023-06-28: 20 via INTRAVENOUS

## 2023-07-01 ENCOUNTER — Ambulatory Visit: Payer: 59 | Admitting: Neurology

## 2023-07-01 ENCOUNTER — Other Ambulatory Visit: Payer: Self-pay

## 2023-07-01 DIAGNOSIS — R202 Paresthesia of skin: Secondary | ICD-10-CM

## 2023-07-02 ENCOUNTER — Ambulatory Visit (INDEPENDENT_AMBULATORY_CARE_PROVIDER_SITE_OTHER): Payer: 59 | Admitting: Neurology

## 2023-07-02 DIAGNOSIS — R202 Paresthesia of skin: Secondary | ICD-10-CM

## 2023-07-02 NOTE — Procedures (Signed)
  [facility]  [address] [place], [address]  [place], [place] [address] Tel: [phone] Fax: [phone] Test Date:  07/02/2023  Patient: Ricardo Alexander DOB: 1985/07/11 Physician: Nita Sickle, DO  Sex: Male Height: 5\' 11"  Ref Phys: Hurman Horn, MD  ID#: [id number]   Technician:    History: This is a 38 year old man referred for evaluation of left arm weakness.  NCV & EMG Findings: Extensive electrodiagnostic testing of the left upper extremity shows:  Left median, ulnar, radial, and mixed palmar sensory responses are within normal limits. Left median and ulnar motor responses are within normal limits. There is no evidence of active or chronic motor axonal loss changes affecting any of the tested muscles.  Motor unit configuration and recruitment pattern is within normal limits.  Impression: This is a normal study of the left upper extremity.  There is no evidence of a cervical radiculopathy, carpal tunnel syndrome, or ulnar neuropathy.   ___________________________ Nita Sickle, DO    Nerve Conduction Studies   Stim Site NR Peak (ms) Norm Peak (ms) O-P Amp (V) Norm O-P Amp  Left Median Anti Sensory (2nd Digit)  32 C  Wrist    3.3 <3.4 31.0 >20  Left Radial Anti Sensory (Base 1st Digit)  32 C  Wrist    2.3 <2.7 23.6 >18  Left Ulnar Anti Sensory (5th Digit)  32 C  Wrist    3.0 <3.1 27.3 >12     Stim Site NR Onset (ms) Norm Onset (ms) O-P Amp (mV) Norm O-P Amp Site1 Site2 Delta-0 (ms) Dist (cm) Vel (m/s) Norm Vel (m/s)  Left Median Motor (Abd Poll Brev)  32 C  Wrist    3.9 <3.9 10.8 >6 Elbow Wrist 5.6 29.0 52 >50  Elbow    9.5  10.0         Left Ulnar Motor (Abd Dig Minimi)  32 C  Wrist    3.0 <3.1 9.1 >7 B Elbow Wrist 4.2 22.0 52 >50  B Elbow    7.2  9.0  A Elbow B Elbow 1.6 10.0 62 >50  A Elbow    8.8  8.5            Stim Site NR Peak (ms) Norm Peak (ms) P-T Amp (V) Site1 Site2 Delta-P (ms) Norm Delta (ms)  Left Median/Ulnar Palm Comparison (Wrist  - 8cm)  32 C  Median Palm    1.8 <2.2 82.2 Median Palm Ulnar Palm 0.1   Ulnar Palm    1.7 <2.2 21.2       Electromyography   Side Muscle Ins.Act Fibs Fasc Recrt Amp Dur Poly Activation Comment  Left 1stDorInt Nml Nml Nml Nml Nml Nml Nml Nml N/A  Left PronatorTeres Nml Nml Nml Nml Nml Nml Nml Nml N/A  Left Biceps Nml Nml Nml Nml Nml Nml Nml Nml N/A  Left Triceps Nml Nml Nml Nml Nml Nml Nml Nml N/A  Left Deltoid Nml Nml Nml Nml Nml Nml Nml Nml N/A  Left Infraspinatus Nml Nml Nml Nml Nml Nml Nml Nml N/A  Left Cervical Parasp Low Nml Nml Nml N.E. *- *- *- *N.E. N/A      Waveforms:

## 2023-11-13 ENCOUNTER — Ambulatory Visit
Admission: EM | Admit: 2023-11-13 | Discharge: 2023-11-13 | Disposition: A | Discharge status: Home / Self Care | Attending: Family Medicine | Admitting: Family Medicine

## 2023-11-13 DIAGNOSIS — K122 Cellulitis and abscess of mouth: Secondary | ICD-10-CM

## 2023-11-13 DIAGNOSIS — K0510 Chronic gingivitis, plaque induced: Secondary | ICD-10-CM | POA: Diagnosis not present

## 2023-11-13 MED ORDER — AMOXICILLIN 875 MG PO TABS: 875.0000 mg | ORAL_TABLET | Freq: Two times a day (BID) | ORAL | 0 refills | Status: DC

## 2023-11-13 MED ORDER — CHLORHEXIDINE GLUCONATE 0.12 % MT SOLN: OROMUCOSAL | 0 refills | Status: DC

## 2023-11-13 NOTE — ED Provider Notes (Signed)
 [name]  Note:  This document was prepared using Conservation officer, historic buildings and may include unintentional dictation errors.  MRN: [id number] DOB: 14-Dec-1985  Subjective:   Ricardo Alexander is a 38 y.o. male presenting for 2-day history of persistent and worsening oral pain, gum pain.  Feels left-sided jaw and neck discomfort, swelling.  He sees a dental specialist twice yearly for cleanings.  No fever, nausea, vomiting.  Denies history of significant dental issues.  No current facility-administered medications for this encounter. No current outpatient medications on file.   Allergies  Allergen Reactions   Gluten Meal     Past Medical History:  Diagnosis Date   Anxiety    Asthma    exercise enduced   Asthma    Chronic headaches    Depression    Sleep apnea    Urticaria      Past Surgical History:  Procedure Laterality Date   HERNIA REPAIR      Family History  Problem Relation Age of Onset   Diabetes Maternal Grandfather    Colon cancer Neg Hx    Rectal cancer Neg Hx    Stomach cancer Neg Hx    Esophageal cancer Neg Hx     Social History   Tobacco Use   Smoking status: Never   Smokeless tobacco: Never  Vaping Use   Vaping status: Never Used  Substance Use Topics   Alcohol use: Never   Drug use: Never    ROS   Objective:   Vitals: BP (P) 111/69 (BP Location: Right Arm)   Pulse (!) (P) 55   Temp (P) 98.6 F (37 C) (Oral)   Resp (P) 16   SpO2 (P) 96%   Physical Exam Constitutional:      General: He is not in acute distress.    Appearance: Normal appearance. He is well-developed and normal weight. He is not ill-appearing, toxic-appearing or diaphoretic.  HENT:     Head: Normocephalic and atraumatic.     Right Ear: External ear normal.     Left Ear: External ear normal.     Nose: Nose normal.     Mouth/Throat:     Pharynx: Oropharynx is clear.     Comments: Erythematic and irritated gumlines worst over the left  lower premolar area and left upper posterior molar area.  Slight gingival recession noted. Eyes:     General: No scleral icterus.       Right eye: No discharge.        Left eye: No discharge.     Extraocular Movements: Extraocular movements intact.  Cardiovascular:     Rate and Rhythm: Normal rate.  Pulmonary:     Effort: Pulmonary effort is normal.  Musculoskeletal:     Cervical back: Normal range of motion.  Neurological:     Mental Status: He is alert and oriented to person, place, and time.  Psychiatric:        Mood and Affect: Mood normal.        Behavior: Behavior normal.        Thought Content: Thought content normal.        Judgment: Judgment normal.     Assessment and Plan :   PDMP not reviewed this encounter.  1. Gingivitis   2. Oral infection    Primary suspicion is for gingivitis and therefore will use chlorhexidine but will cover for odontogenic infection with amoxicillin as well.  Follow-up with his dental specialist.  Counseled patient  on potential for adverse effects with medications prescribed/recommended today, ER and return-to-clinic precautions discussed, patient verbalized understanding.    Adolph Hoop, [place] 11/13/23 [phone]

## 2023-11-13 NOTE — ED Triage Notes (Signed)
 Pt c/o gum pain/swelling and swelling to both sides of neck x 2 days-denies fever-NAD-steady gait

## 2024-02-18 NOTE — Progress Notes (Signed)
 [facility] [address] [place] [address] Phone: [phone]   Assessment and Plan:     1. 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 Numbness and tingling of left upper extremity -Chronic with exacerbation, initial visit - Left upper extremity numbness and tingling present for 10+ years with decreased adipose tissue in the left thoracic outlet on MRI consistent with left thoracic outlet syndrome, likely neurologic. - Workup has included MRI brachial plexus showing: Striking paucity of adipose tissues resulting in reduced space around components of the brachial plexus to allow for separation and evaluation.  Tracing the nerve roots, trunks, divisions, cords, and resulting nerves, I do not observe abnormal signal or discrete mass.  IV contrast was not administered and hence assessment for abnormal enhancement is not part of today's exam.  EMG/NCS unremarkable.  Elevated alk phos, bone type with unremarkable NM bone scan on 06/28/2023.  Otherwise unremarkable lab work. - I am not able to determine the cause of decreased adipose tissue based on imaging report.  No note of extra rib, clavicular compression, mass, cervical pathology.  I do think that patient could benefit from thoracic outlet MRI with contrast.  I will hold off ordering MRI at this time and have patient be evaluated by surgical team first as they may have specific parameters, contrast protocols, aggravated positional MRI imaging recommendations.  Options for surgical evaluation include peripheral nerve surgical team, thoracic surgical teams, neurosurgical teams.  [facility] in [place] treats patients with thoracic outlet syndrome, so patient agreed to referral to their clinic. - Continue HEP for thoracic outlet syndrome and decreased heavy upper extremity weight lifting until evaluated by neurosurgery  Pertinent previous records reviewed  include 20 pages of printed report from [place] physicians including lab work, thoracic outlet MRI, workup, management.   Follow Up: As needed   Subjective:   I, Ricardo Alexander, am serving as a Neurosurgeon for Ricardo Alexander  Chief Complaint: left sided weakness   HPI:   02/21/2024 Patient is a 38 year old male with left sided weakness. Patient states weakness for years. Weakness starts in his neck , decrease ROM down to the trap and scap. Left arm decreased ROM, isnt able to weight train. States its unstable. Left is weaker than right. Intermittent numbness and tingling. Hx of bilat thoracic outlet. No meds. Hx of cervical narrowing and bone spurs.     Relevant Historical Information: None pertinent  Additional pertinent review of systems negative.   Current Outpatient Medications:    amoxicillin  (AMOXIL ) 875 MG tablet, Take 1 tablet (875 mg total) by mouth 2 (two) times daily., Disp: 20 tablet, Rfl: 0   chlorhexidine  (PERIDEX ) 0.12 % solution, Rinse mouth with 15mL for 30 seconds twice daily., Disp: 200 mL, Rfl: 0   Objective:     Vitals:   02/21/24 1120  BP: 110/72  Pulse: 90  SpO2: 98%  Weight: 196 lb (88.9 kg)  Height: 5' 11 (1.803 m)      Body mass index is 27.34 kg/m.    Physical Exam:    General: Well-appearing, cooperative, sitting comfortably in no acute distress.  HEENT: Normocephalic, atraumatic.   Neck: No gross abnormality.  Cardiovascular: No pallor or cyanosis. Resp: Comfortable WOB.   Abdomen: Non distended.   Skin: Warm and dry; no focal rashes identified on limited exam. Extremities: No cyanosis or edema.  Decreased muscle tone in left trapezius, thoracic outlet compared to right Neuro:  Gross motor and sensory intact. Gait normal. Psychiatric: Mood and affect are appropriate.    Electronically signed by:  Odis [facility] 11:55 AM 02/21/24

## 2024-02-21 ENCOUNTER — Ambulatory Visit (INDEPENDENT_AMBULATORY_CARE_PROVIDER_SITE_OTHER): Admitting: Sports Medicine

## 2024-02-21 VITALS — BP 110/72 | HR 90 | Ht 71.0 in | Wt 196.0 lb

## 2024-02-21 DIAGNOSIS — R202 Paresthesia of skin: Secondary | ICD-10-CM | POA: Diagnosis not present

## 2024-02-21 DIAGNOSIS — R200 Anesthesia of skin: Secondary | ICD-10-CM | POA: Diagnosis not present

## 2024-02-21 DIAGNOSIS — G540 Brachial plexus disorders: Secondary | ICD-10-CM

## 2024-02-21 NOTE — Patient Instructions (Signed)
 Neurosurgery referral   Thoracic HEP   As needed follow up

## 2024-03-02 ENCOUNTER — Encounter: Payer: Self-pay | Admitting: Sports Medicine

## 2024-03-30 ENCOUNTER — Inpatient Hospital Stay
Admission: RE | Admit: 2024-03-30 | Discharge: 2024-03-30 | Disposition: A | Payer: Self-pay | Source: Ambulatory Visit | Attending: Orthopedic Surgery | Admitting: Orthopedic Surgery

## 2024-03-30 ENCOUNTER — Inpatient Hospital Stay
Admission: RE | Admit: 2024-03-30 | Discharge: 2024-03-30 | Disposition: A | Discharge status: Home / Self Care | Payer: Self-pay | Source: Ambulatory Visit | Attending: Orthopedic Surgery | Admitting: Orthopedic Surgery

## 2024-03-30 ENCOUNTER — Other Ambulatory Visit: Payer: Self-pay

## 2024-03-30 DIAGNOSIS — L509 Urticaria, unspecified: Secondary | ICD-10-CM | POA: Insufficient documentation

## 2024-03-30 DIAGNOSIS — K219 Gastro-esophageal reflux disease without esophagitis: Secondary | ICD-10-CM | POA: Insufficient documentation

## 2024-03-30 DIAGNOSIS — E049 Nontoxic goiter, unspecified: Secondary | ICD-10-CM | POA: Insufficient documentation

## 2024-03-30 DIAGNOSIS — Z049 Encounter for examination and observation for unspecified reason: Secondary | ICD-10-CM

## 2024-03-30 DIAGNOSIS — R748 Abnormal levels of other serum enzymes: Secondary | ICD-10-CM | POA: Insufficient documentation

## 2024-03-30 DIAGNOSIS — K429 Umbilical hernia without obstruction or gangrene: Secondary | ICD-10-CM | POA: Insufficient documentation

## 2024-03-30 NOTE — Progress Notes (Unsigned)
 Referring Physician:  Wonda Worth SQUIBB, PA [address] [place],  [place] [address]  Primary Physician:  Wonda Worth SQUIBB, PA  History of Present Illness: 04/05/2024 Mr. Marvie Calender has a history of asthma, goiter, scapular dykinesis.   Referral from Dr. Leonce for left thoracic outlet syndrome. History of left upper extremity numbness and tingling x 10+ years along with decreased adipose tissue in left thoracic outlet on MRI.   He's had MRI of brachial plexus, EMG, NM bone scan.   He has intermittent left sided neck pain into his shoulder and shoulder blade. No arm pain. He has more constant weakness and instability in his left arm as well. He has intermittent numbness and tingling in left arm as well. Pain is worse with any increased activity. Pain is better with PT.   Tobacco use: Does not smoke.   Bowel/Bladder Dysfunction: none  Conservative measures:  Physical therapy:  has participated in PT at [facility] Multimodal medical therapy including regular antiinflammatories: Gabapentin  Injections:   Possible cervical ESI years ago  Past Surgery: no spine or neck surgery, no surgery on his left shoulder.   Shahmeer Bunn has no symptoms of cervical myelopathy.  The symptoms are causing a significant impact on the patient's life.   Review of Systems:  A 10 point review of systems is negative, except for the pertinent positives and negatives detailed in the HPI.  Past Medical History: Past Medical History:  Diagnosis Date   Anxiety    Asthma    exercise enduced   Asthma    Chronic headaches    Depression    Sleep apnea    Urticaria     Past Surgical History: Past Surgical History:  Procedure Laterality Date   HERNIA REPAIR      Allergies: Allergies as of 04/05/2024 - Review Complete 04/05/2024  Allergen Reaction Noted   Gluten meal  11/13/2023    Medications: Outpatient Encounter Medications as of 04/05/2024  Medication Sig    [DISCONTINUED] amoxicillin  (AMOXIL ) 875 MG tablet Take 1 tablet (875 mg total) by mouth 2 (two) times daily.   [DISCONTINUED] chlorhexidine  (PERIDEX ) 0.12 % solution Rinse mouth with 15mL for 30 seconds twice daily.   No facility-administered encounter medications on file as of 04/05/2024.    Social History: Social History   Tobacco Use   Smoking status: Never   Smokeless tobacco: Never  Vaping Use   Vaping status: Never Used  Substance Use Topics   Alcohol use: Never   Drug use: Never    Family Medical History: Family History  Problem Relation Age of Onset   Diabetes Maternal Grandfather    Colon cancer Neg Hx    Rectal cancer Neg Hx    Stomach cancer Neg Hx    Esophageal cancer Neg Hx     Physical Examination: Vitals:   04/05/24 1410  BP: 132/80    General: Patient is well developed, well nourished, calm, collected, and in no apparent distress. Attention to examination is appropriate.  Respiratory: Patient is breathing without any difficulty.   NEUROLOGICAL:     Awake, alert, oriented to person, place, and time.  Speech is clear and fluent. Fund of knowledge is appropriate.   Cranial Nerves: Pupils equal round and reactive to light.  Facial tone is symmetric.    No posterior cervical tenderness. Minimal tenderness in left trapezial region.   Good ROM of both shoulders with no pain.   No abnormal lesions on exposed skin.  Strength: Side Biceps Triceps Deltoid Interossei Grip Wrist Ext. Wrist Flex.  R 5 5 5 5 5 5 5   L 5 5 5 5 5 5 5    Side Iliopsoas Quads Hamstring PF DF EHL  R 5 5 5 5 5 5   L 5 5 5 5 5 5    Reflexes are 2+ and symmetric at the biceps, brachioradialis, patella and achilles.   Hoffman's is absent.  Clonus is not present.   Bilateral upper and lower extremity sensation is intact to light touch.     Gait is normal.    Dr. Claudene was in to examine patient as well. He has positive Allens test on left arm.   Medical Decision  Making  Imaging: MRI of brachial plexus dated 08/20/23:      Above imaging reviewed by Dr. Claudene.    EMG of left upper extremity dated 07/02/23:  NCV & EMG Findings: Extensive electrodiagnostic testing of the left upper extremity shows:  Left median, ulnar, radial, and mixed palmar sensory responses are within normal limits. Left median and ulnar motor responses are within normal limits. There is no evidence of active or chronic motor axonal loss changes affecting any of the tested muscles.  Motor unit configuration and recruitment pattern is within normal limits.   Impression: This is a normal study of the left upper extremity.  There is no evidence of a cervical radiculopathy, carpal tunnel syndrome, or ulnar neuropathy.     ___________________________ Tonita Blanch, DO   Assessment and Plan: Mr. Peschke 10+ year history of intermittent left sided neck pain into his shoulder and shoulder blade. No arm pain. He has more constant weakness and instability in his left arm as well. He has intermittent numbness and tingling in left arm. Nothing in his hand.    Previous EMG was normal. Dr. Claudene reviewed above MRI. He has positive Allens test and this is suspicious for thoracic outlet syndrome.   Treatment options discussed with patient and following plan made:   - Cervical xrays on his way out to evaluate for cervical rib. Will message him with results.  - MRI of cervical spine ordered to further evaluate neck and shoulder pain.  - Referral to Dr. Medford Gaskins with vascular in [facility] for evaluation of possible thoracic outlet syndrome.  - Will schedule MyChart visit to review MRI results once I get them back.   I spent a total of 45 minutes in face-to-face and non-face-to-face activities related to this patient's care today including review of outside records, review of imaging, review of symptoms, physical exam, discussion of differential diagnosis, discussion of treatment options,  and documentation.   Thank you for involving me in the care of this patient.   Glade Boys PA-C Dept. of Neurosurgery

## 2024-04-05 ENCOUNTER — Ambulatory Visit

## 2024-04-05 ENCOUNTER — Ambulatory Visit: Admitting: Orthopedic Surgery

## 2024-04-05 ENCOUNTER — Encounter: Payer: Self-pay | Admitting: Orthopedic Surgery

## 2024-04-05 VITALS — BP 132/80 | Ht 71.0 in | Wt 194.0 lb

## 2024-04-05 DIAGNOSIS — R29898 Other symptoms and signs involving the musculoskeletal system: Secondary | ICD-10-CM | POA: Diagnosis not present

## 2024-04-05 DIAGNOSIS — R202 Paresthesia of skin: Secondary | ICD-10-CM

## 2024-04-05 DIAGNOSIS — M542 Cervicalgia: Secondary | ICD-10-CM

## 2024-04-05 DIAGNOSIS — R200 Anesthesia of skin: Secondary | ICD-10-CM | POA: Diagnosis not present

## 2024-04-05 DIAGNOSIS — Z049 Encounter for examination and observation for unspecified reason: Secondary | ICD-10-CM

## 2024-04-05 NOTE — Patient Instructions (Addendum)
 It was so nice to see you today. Thank you so much for coming in.    I want to get an MRI of your neck to look into things further. We will get this approved through your insurance and [facility] will call you to schedule the appointment. Ask about your patient responsibility. You do not need to pay this prior to getting MRI, they can bill you.   After you have the MRI, it can take 14-28 days for me to get the results back. If I don't have them in 2 weeks, we will call to try to get the results.   Once I have the results, we will call you to schedule a follow up MyChart visit with me to review them.   Will do xrays of your neck today and message you with the results.   I put in a referral to vascular surgery (Dr. Medford Gaskins) in [place]. They should call you to schedule or you can call him at [phone].   Please do not hesitate to call if you have any questions or concerns. You can also message me in MyChart.   Glade Boys PA-C [phone]     The physicians and staff at [facility] at [facility] are committed to providing excellent care. You may receive a survey asking for feedback about your experience at our office. We value you your feedback and appreciate you taking the time to to fill it out. The [facility] leadership team is also available to discuss your experience in person, feel free to contact us  [phone].

## 2024-05-16 ENCOUNTER — Other Ambulatory Visit: Payer: Self-pay

## 2024-05-26 ENCOUNTER — Other Ambulatory Visit: Payer: Self-pay | Admitting: *Deleted

## 2024-05-26 DIAGNOSIS — M25512 Pain in left shoulder: Secondary | ICD-10-CM

## 2024-06-19 NOTE — Progress Notes (Unsigned)
 "   Patient name: Ricardo Alexander MRN: [id number] DOB: 05/22/1986 Sex: male  REASON FOR CONSULT: Weakness and instability in left arm, evaluate for TOS  HPI: Ricardo Alexander is a 39 y.o. male, with history of anxiety and depression that presents for evaluation of weakness and instability in the left arm and possible underlying TOS.  Patient is referred by neurosurgery.  Apparently has had 10 years of symptoms in the left arm.  He has had a prior MRI brachial plexus 08/20/2023 with no specific abnormality noted.  Also underwent left upper extremity nerve conduction studies and EMGs that were normal.  Past Medical History:  Diagnosis Date   Anxiety    Asthma    exercise enduced   Asthma    Chronic headaches    Depression    Sleep apnea    Urticaria     Past Surgical History:  Procedure Laterality Date   HERNIA REPAIR      Family History  Problem Relation Age of Onset   Diabetes Maternal Grandfather    Colon cancer Neg Hx    Rectal cancer Neg Hx    Stomach cancer Neg Hx    Esophageal cancer Neg Hx     SOCIAL HISTORY: Social History   Socioeconomic History   Marital status: Divorced    Spouse name: Not on file   Number of children: Not on file   Years of education: Not on file   Highest education level: Not on file  Occupational History   Not on file  Tobacco Use   Smoking status: Never   Smokeless tobacco: Never  Vaping Use   Vaping status: Never Used  Substance and Sexual Activity   Alcohol use: Never   Drug use: Never   Sexual activity: Not on file  Other Topics Concern   Not on file  Social History Narrative   Not on file   Social Drivers of Health   Tobacco Use: Low Risk (04/05/2024)   Patient History    Smoking Tobacco Use: Never    Smokeless Tobacco Use: Never    Passive Exposure: Not on file  Financial Resource Strain: Not on file  Food Insecurity: Not on file  Transportation Needs: Not on file  Physical Activity: Not on file  Stress: Not on file   Social Connections: Not on file  Intimate Partner Violence: Not on file  Depression (EYV7-0): Not on file  Alcohol Screen: Not on file  Housing: Not on file  Utilities: Not on file  Health Literacy: Not on file    Allergies[1]  No current outpatient medications on file.   No current facility-administered medications for this visit.    REVIEW OF SYSTEMS:  [X]  denotes positive finding, [ ]  denotes negative finding Cardiac  Comments:  Chest pain or chest pressure: ***   Shortness of breath upon exertion:    Short of breath when lying flat:    Irregular heart rhythm:        Vascular    Pain in calf, thigh, or hip brought on by ambulation:    Pain in feet at night that wakes you up from your sleep:     Blood clot in your veins:    Leg swelling:         Pulmonary    Oxygen at home:    Productive cough:     Wheezing:         Neurologic    Sudden weakness in arms or legs:  Sudden numbness in arms or legs:     Sudden onset of difficulty speaking or slurred speech:    Temporary loss of vision in one eye:     Problems with dizziness:         Gastrointestinal    Blood in stool:     Vomited blood:         Genitourinary    Burning when urinating:     Blood in urine:        Psychiatric    Major depression:         Hematologic    Bleeding problems:    Problems with blood clotting too easily:        Skin    Rashes or ulcers:        Constitutional    Fever or chills:      PHYSICAL EXAM: There were no vitals filed for this visit.  GENERAL: The patient is a well-nourished male, in no acute distress. The vital signs are documented above. CARDIAC: There is a regular rate and rhythm.  VASCULAR: *** PULMONARY: There is good air exchange bilaterally without wheezing or rales. ABDOMEN: Soft and non-tender with normal pitched bowel sounds.  MUSCULOSKELETAL: There are no major deformities or cyanosis. NEUROLOGIC: No focal weakness or paresthesias are detected. SKIN:  There are no ulcers or rashes noted. PSYCHIATRIC: The patient has a normal affect.  DATA:   Upper extremity Doppler TOS studies  Assessment/Plan:  39 y.o. male, with history of anxiety and depression that presents for evaluation of weakness and instability in the left arm and possible underlying TOS.  Patient is referred by neurosurgery.  Apparently has had 10 years of symptoms in the left arm.  He has had a prior MRI brachial plexus 08/20/2023 with no specific abnormality noted.  Also underwent left upper extremity nerve conduction studies and EMGs that were normal.     Lonni DOROTHA Gaskins, MD [facility] Office: [phone]        [1]  Allergies Allergen Reactions   Gluten Meal    "

## 2024-06-20 ENCOUNTER — Encounter: Payer: Self-pay | Admitting: Vascular Surgery

## 2024-06-20 ENCOUNTER — Ambulatory Visit: Admitting: Vascular Surgery

## 2024-06-20 ENCOUNTER — Ambulatory Visit (HOSPITAL_COMMUNITY)
Admission: RE | Admit: 2024-06-20 | Discharge: 2024-06-20 | Disposition: A | Discharge status: Home / Self Care | Source: Ambulatory Visit | Attending: Vascular Surgery | Admitting: Vascular Surgery

## 2024-06-20 VITALS — BP 133/80 | HR 50 | Temp 98.2°F | Resp 16 | Ht 71.0 in | Wt 192.6 lb

## 2024-06-20 DIAGNOSIS — M542 Cervicalgia: Secondary | ICD-10-CM | POA: Insufficient documentation

## 2024-06-20 DIAGNOSIS — M25512 Pain in left shoulder: Secondary | ICD-10-CM | POA: Diagnosis not present
